# Patient Record
Sex: Female | Born: 1985
Health system: Southern US, Community
[De-identification: ages and names within clinical notes are randomized; demographics above are authoritative.]

## PROBLEM LIST (undated history)

## (undated) DIAGNOSIS — IMO0002 Reserved for concepts with insufficient information to code with codable children: Secondary | ICD-10-CM

## (undated) DIAGNOSIS — J342 Deviated nasal septum: Secondary | ICD-10-CM

## (undated) DIAGNOSIS — L509 Urticaria, unspecified: Secondary | ICD-10-CM

## (undated) DIAGNOSIS — R7303 Prediabetes: Secondary | ICD-10-CM

## (undated) DIAGNOSIS — M545 Low back pain, unspecified: Secondary | ICD-10-CM

## (undated) DIAGNOSIS — R87619 Unspecified abnormal cytological findings in specimens from cervix uteri: Secondary | ICD-10-CM

## (undated) HISTORY — DX: Reserved for concepts with insufficient information to code with codable children: IMO0002

## (undated) HISTORY — DX: Low back pain: M54.5

## (undated) HISTORY — DX: Unspecified abnormal cytological findings in specimens from cervix uteri: R87.619

## (undated) HISTORY — PX: EXCISION ORAL TUMOR: SHX6265

## (undated) HISTORY — DX: Urticaria, unspecified: L50.9

## (undated) HISTORY — PX: WISDOM TOOTH EXTRACTION: SHX21

## (undated) HISTORY — DX: Low back pain, unspecified: M54.50

---
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
## 2004-05-24 ENCOUNTER — Emergency Department (HOSPITAL_COMMUNITY): Admission: EM | Admit: 2004-05-24 | Discharge: 2004-05-24 | Payer: Self-pay | Admitting: Emergency Medicine

## 2005-02-18 ENCOUNTER — Emergency Department (HOSPITAL_COMMUNITY): Admission: EM | Admit: 2005-02-18 | Discharge: 2005-02-18 | Payer: Self-pay | Admitting: Emergency Medicine

## 2005-04-21 ENCOUNTER — Ambulatory Visit: Payer: Self-pay | Admitting: Internal Medicine

## 2005-06-14 ENCOUNTER — Ambulatory Visit: Payer: Self-pay | Admitting: Internal Medicine

## 2005-07-02 ENCOUNTER — Emergency Department (HOSPITAL_COMMUNITY): Admission: EM | Admit: 2005-07-02 | Discharge: 2005-07-02 | Payer: Self-pay | Admitting: Emergency Medicine

## 2005-08-07 ENCOUNTER — Ambulatory Visit: Payer: Self-pay | Admitting: Internal Medicine

## 2005-10-30 ENCOUNTER — Ambulatory Visit: Payer: Self-pay | Admitting: Hospitalist

## 2006-01-10 ENCOUNTER — Emergency Department (HOSPITAL_COMMUNITY): Admission: EM | Admit: 2006-01-10 | Discharge: 2006-01-11 | Payer: Self-pay | Admitting: Emergency Medicine

## 2006-01-22 ENCOUNTER — Ambulatory Visit: Payer: Self-pay | Admitting: Internal Medicine

## 2006-02-14 ENCOUNTER — Ambulatory Visit: Payer: Self-pay | Admitting: Internal Medicine

## 2006-02-15 ENCOUNTER — Ambulatory Visit: Payer: Self-pay | Admitting: Internal Medicine

## 2006-03-05 ENCOUNTER — Ambulatory Visit (HOSPITAL_COMMUNITY): Admission: RE | Admit: 2006-03-05 | Discharge: 2006-03-05 | Payer: Self-pay | Admitting: *Deleted

## 2006-04-17 ENCOUNTER — Emergency Department (HOSPITAL_COMMUNITY): Admission: EM | Admit: 2006-04-17 | Discharge: 2006-04-17 | Payer: Self-pay | Admitting: Family Medicine

## 2006-05-03 ENCOUNTER — Ambulatory Visit: Payer: Self-pay | Admitting: Internal Medicine

## 2006-05-04 ENCOUNTER — Ambulatory Visit: Payer: Self-pay | Admitting: Internal Medicine

## 2006-07-16 ENCOUNTER — Ambulatory Visit: Payer: Self-pay | Admitting: Hospitalist

## 2006-07-26 ENCOUNTER — Ambulatory Visit: Payer: Self-pay | Admitting: Internal Medicine

## 2006-08-14 ENCOUNTER — Emergency Department (HOSPITAL_COMMUNITY): Admission: EM | Admit: 2006-08-14 | Discharge: 2006-08-14 | Payer: Self-pay | Admitting: Family Medicine

## 2006-10-15 ENCOUNTER — Ambulatory Visit: Payer: Self-pay | Admitting: Internal Medicine

## 2006-10-22 DIAGNOSIS — J4520 Mild intermittent asthma, uncomplicated: Secondary | ICD-10-CM | POA: Insufficient documentation

## 2006-11-21 ENCOUNTER — Ambulatory Visit: Payer: Self-pay | Admitting: Internal Medicine

## 2006-11-21 DIAGNOSIS — J019 Acute sinusitis, unspecified: Secondary | ICD-10-CM | POA: Insufficient documentation

## 2006-11-21 DIAGNOSIS — L209 Atopic dermatitis, unspecified: Secondary | ICD-10-CM | POA: Insufficient documentation

## 2007-01-07 ENCOUNTER — Ambulatory Visit: Payer: Self-pay | Admitting: Internal Medicine

## 2007-01-14 ENCOUNTER — Emergency Department (HOSPITAL_COMMUNITY): Admission: EM | Admit: 2007-01-14 | Discharge: 2007-01-14 | Payer: Self-pay | Admitting: Emergency Medicine

## 2007-01-15 DIAGNOSIS — R87610 Atypical squamous cells of undetermined significance on cytologic smear of cervix (ASC-US): Secondary | ICD-10-CM | POA: Insufficient documentation

## 2007-02-06 ENCOUNTER — Ambulatory Visit: Payer: Self-pay | Admitting: Hospitalist

## 2007-02-06 ENCOUNTER — Encounter (INDEPENDENT_AMBULATORY_CARE_PROVIDER_SITE_OTHER): Payer: Self-pay | Admitting: Internal Medicine

## 2007-02-06 ENCOUNTER — Encounter (INDEPENDENT_AMBULATORY_CARE_PROVIDER_SITE_OTHER): Payer: Self-pay | Admitting: *Deleted

## 2007-02-06 LAB — CONVERTED CEMR LAB
AST: 17 units/L (ref 0–37)
Alkaline Phosphatase: 67 units/L (ref 39–117)
BUN: 11 mg/dL (ref 6–23)
CO2: 22 meq/L (ref 19–32)
Calcium: 10.1 mg/dL (ref 8.4–10.5)
Chlamydia, DNA Probe: NEGATIVE
Creatinine, Ser: 0.66 mg/dL (ref 0.40–1.20)
GC Probe Amp, Genital: NEGATIVE
MCHC: 32.4 g/dL (ref 30.0–36.0)
MCV: 92 fL (ref 78.0–100.0)
Potassium: 4.4 meq/L (ref 3.5–5.3)
RDW: 13.6 % (ref 11.5–14.0)
WBC: 6.5 10*3/uL (ref 4.0–10.5)

## 2007-02-07 LAB — CONVERTED CEMR LAB
Candida species: NEGATIVE
Gardnerella vaginalis: POSITIVE — AB
Trichomonal Vaginitis: NEGATIVE

## 2007-02-19 ENCOUNTER — Telehealth: Payer: Self-pay | Admitting: *Deleted

## 2007-02-22 ENCOUNTER — Telehealth: Payer: Self-pay | Admitting: *Deleted

## 2007-03-01 ENCOUNTER — Telehealth: Payer: Self-pay | Admitting: *Deleted

## 2007-03-14 ENCOUNTER — Emergency Department (HOSPITAL_COMMUNITY): Admission: EM | Admit: 2007-03-14 | Discharge: 2007-03-14 | Payer: Self-pay | Admitting: Family Medicine

## 2007-03-15 ENCOUNTER — Telehealth: Payer: Self-pay | Admitting: *Deleted

## 2007-03-27 ENCOUNTER — Ambulatory Visit: Payer: Self-pay | Admitting: *Deleted

## 2007-03-27 ENCOUNTER — Encounter: Payer: Self-pay | Admitting: Obstetrics and Gynecology

## 2007-03-27 ENCOUNTER — Other Ambulatory Visit: Admission: RE | Admit: 2007-03-27 | Discharge: 2007-03-27 | Payer: Self-pay | Admitting: Obstetrics and Gynecology

## 2007-04-02 ENCOUNTER — Ambulatory Visit: Payer: Self-pay | Admitting: Internal Medicine

## 2007-04-10 ENCOUNTER — Ambulatory Visit: Payer: Self-pay | Admitting: Obstetrics & Gynecology

## 2007-04-10 ENCOUNTER — Encounter (INDEPENDENT_AMBULATORY_CARE_PROVIDER_SITE_OTHER): Payer: Self-pay | Admitting: *Deleted

## 2007-05-21 ENCOUNTER — Emergency Department (HOSPITAL_COMMUNITY): Admission: EM | Admit: 2007-05-21 | Discharge: 2007-05-21 | Payer: Self-pay | Admitting: Emergency Medicine

## 2007-05-22 ENCOUNTER — Encounter (INDEPENDENT_AMBULATORY_CARE_PROVIDER_SITE_OTHER): Payer: Self-pay | Admitting: *Deleted

## 2007-06-24 ENCOUNTER — Ambulatory Visit: Payer: Self-pay | Admitting: Internal Medicine

## 2007-07-21 ENCOUNTER — Emergency Department (HOSPITAL_COMMUNITY): Admission: EM | Admit: 2007-07-21 | Discharge: 2007-07-21 | Payer: Self-pay | Admitting: Family Medicine

## 2007-07-22 ENCOUNTER — Telehealth (INDEPENDENT_AMBULATORY_CARE_PROVIDER_SITE_OTHER): Payer: Self-pay | Admitting: *Deleted

## 2007-07-26 ENCOUNTER — Ambulatory Visit: Payer: Self-pay | Admitting: Internal Medicine

## 2007-08-13 ENCOUNTER — Telehealth: Payer: Self-pay | Admitting: *Deleted

## 2007-09-04 ENCOUNTER — Ambulatory Visit: Payer: Self-pay | Admitting: Obstetrics & Gynecology

## 2007-09-17 ENCOUNTER — Ambulatory Visit: Payer: Self-pay | Admitting: Internal Medicine

## 2007-09-20 ENCOUNTER — Telehealth: Payer: Self-pay | Admitting: *Deleted

## 2007-09-26 DIAGNOSIS — H5710 Ocular pain, unspecified eye: Secondary | ICD-10-CM | POA: Insufficient documentation

## 2007-09-27 ENCOUNTER — Ambulatory Visit: Payer: Self-pay | Admitting: Internal Medicine

## 2007-09-27 ENCOUNTER — Encounter (INDEPENDENT_AMBULATORY_CARE_PROVIDER_SITE_OTHER): Payer: Self-pay | Admitting: *Deleted

## 2007-10-11 ENCOUNTER — Emergency Department (HOSPITAL_COMMUNITY): Admission: EM | Admit: 2007-10-11 | Discharge: 2007-10-11 | Payer: Self-pay | Admitting: Family Medicine

## 2007-10-17 ENCOUNTER — Emergency Department (HOSPITAL_COMMUNITY): Admission: EM | Admit: 2007-10-17 | Discharge: 2007-10-17 | Payer: Self-pay | Admitting: Emergency Medicine

## 2007-10-21 ENCOUNTER — Emergency Department (HOSPITAL_COMMUNITY): Admission: EM | Admit: 2007-10-21 | Discharge: 2007-10-21 | Payer: Self-pay | Admitting: Family Medicine

## 2007-11-13 ENCOUNTER — Ambulatory Visit: Payer: Self-pay | Admitting: Obstetrics & Gynecology

## 2007-11-13 ENCOUNTER — Other Ambulatory Visit: Admission: RE | Admit: 2007-11-13 | Discharge: 2007-11-13 | Payer: Self-pay | Admitting: Obstetrics and Gynecology

## 2007-11-21 ENCOUNTER — Encounter (INDEPENDENT_AMBULATORY_CARE_PROVIDER_SITE_OTHER): Payer: Self-pay | Admitting: *Deleted

## 2007-11-29 ENCOUNTER — Ambulatory Visit: Payer: Self-pay | Admitting: Obstetrics and Gynecology

## 2007-12-17 ENCOUNTER — Telehealth: Payer: Self-pay | Admitting: *Deleted

## 2008-01-17 ENCOUNTER — Emergency Department (HOSPITAL_COMMUNITY): Admission: EM | Admit: 2008-01-17 | Discharge: 2008-01-17 | Payer: Self-pay | Admitting: Family Medicine

## 2008-02-20 ENCOUNTER — Telehealth (INDEPENDENT_AMBULATORY_CARE_PROVIDER_SITE_OTHER): Payer: Self-pay | Admitting: *Deleted

## 2008-03-27 ENCOUNTER — Telehealth (INDEPENDENT_AMBULATORY_CARE_PROVIDER_SITE_OTHER): Payer: Self-pay | Admitting: *Deleted

## 2008-05-19 ENCOUNTER — Telehealth (INDEPENDENT_AMBULATORY_CARE_PROVIDER_SITE_OTHER): Payer: Self-pay | Admitting: *Deleted

## 2008-05-29 ENCOUNTER — Encounter: Payer: Self-pay | Admitting: Obstetrics & Gynecology

## 2008-05-29 ENCOUNTER — Ambulatory Visit: Payer: Self-pay | Admitting: Obstetrics & Gynecology

## 2008-07-29 ENCOUNTER — Emergency Department (HOSPITAL_COMMUNITY): Admission: EM | Admit: 2008-07-29 | Discharge: 2008-07-29 | Payer: Self-pay | Admitting: Family Medicine

## 2008-08-05 ENCOUNTER — Ambulatory Visit: Payer: Self-pay | Admitting: Internal Medicine

## 2008-09-21 ENCOUNTER — Emergency Department (HOSPITAL_COMMUNITY): Admission: EM | Admit: 2008-09-21 | Discharge: 2008-09-21 | Payer: Self-pay | Admitting: Emergency Medicine

## 2008-12-18 ENCOUNTER — Ambulatory Visit: Payer: Self-pay | Admitting: Obstetrics and Gynecology

## 2008-12-18 ENCOUNTER — Encounter: Payer: Self-pay | Admitting: Obstetrics & Gynecology

## 2008-12-18 LAB — CONVERTED CEMR LAB

## 2009-02-24 ENCOUNTER — Ambulatory Visit: Payer: Self-pay | Admitting: Obstetrics and Gynecology

## 2009-04-23 ENCOUNTER — Emergency Department (HOSPITAL_COMMUNITY): Admission: EM | Admit: 2009-04-23 | Discharge: 2009-04-23 | Payer: Self-pay | Admitting: Emergency Medicine

## 2009-05-26 ENCOUNTER — Telehealth: Payer: Self-pay | Admitting: Internal Medicine

## 2009-05-27 ENCOUNTER — Telehealth: Payer: Self-pay | Admitting: *Deleted

## 2009-06-10 ENCOUNTER — Ambulatory Visit: Payer: Self-pay | Admitting: Internal Medicine

## 2009-06-10 ENCOUNTER — Encounter: Payer: Self-pay | Admitting: Internal Medicine

## 2009-06-10 DIAGNOSIS — R5383 Other fatigue: Secondary | ICD-10-CM

## 2009-06-10 DIAGNOSIS — R5381 Other malaise: Secondary | ICD-10-CM | POA: Insufficient documentation

## 2009-06-11 ENCOUNTER — Encounter: Payer: Self-pay | Admitting: Internal Medicine

## 2009-06-11 LAB — CONVERTED CEMR LAB
ALT: 9 units/L (ref 0–35)
Albumin: 4.7 g/dL (ref 3.5–5.2)
BUN: 13 mg/dL (ref 6–23)
Basophils Relative: 1 % (ref 0–1)
Calcium: 9.8 mg/dL (ref 8.4–10.5)
Chloride: 103 meq/L (ref 96–112)
Eosinophils Absolute: 0.7 10*3/uL (ref 0.0–0.7)
Eosinophils Relative: 10 % — ABNORMAL HIGH (ref 0–5)
Glucose, Bld: 92 mg/dL (ref 70–99)
HCT: 37.4 % (ref 36.0–46.0)
Hemoglobin, Urine: NEGATIVE
Lymphs Abs: 3.3 10*3/uL (ref 0.7–4.0)
MCHC: 32.1 g/dL (ref 30.0–36.0)
Neutrophils Relative %: 35 % — ABNORMAL LOW (ref 43–77)
Platelets: 323 10*3/uL (ref 150–400)
Protein, ur: NEGATIVE mg/dL
Sodium: 138 meq/L (ref 135–145)
Total Bilirubin: 0.2 mg/dL — ABNORMAL LOW (ref 0.3–1.2)
Total Protein: 7.7 g/dL (ref 6.0–8.3)
Urine Glucose: NEGATIVE mg/dL
pH: 6.5 (ref 5.0–8.0)

## 2009-07-12 ENCOUNTER — Telehealth: Payer: Self-pay | Admitting: Internal Medicine

## 2009-07-15 ENCOUNTER — Encounter: Payer: Self-pay | Admitting: Physician Assistant

## 2009-07-15 ENCOUNTER — Ambulatory Visit: Payer: Self-pay | Admitting: Obstetrics & Gynecology

## 2009-07-28 ENCOUNTER — Telehealth: Payer: Self-pay | Admitting: Internal Medicine

## 2009-08-10 ENCOUNTER — Emergency Department (HOSPITAL_COMMUNITY): Admission: EM | Admit: 2009-08-10 | Discharge: 2009-08-10 | Payer: Self-pay | Admitting: Family Medicine

## 2009-09-02 ENCOUNTER — Emergency Department (HOSPITAL_COMMUNITY): Admission: EM | Admit: 2009-09-02 | Discharge: 2009-09-02 | Payer: Self-pay | Admitting: Emergency Medicine

## 2009-09-14 ENCOUNTER — Telehealth: Payer: Self-pay | Admitting: Internal Medicine

## 2009-11-27 ENCOUNTER — Emergency Department (HOSPITAL_COMMUNITY): Admission: EM | Admit: 2009-11-27 | Discharge: 2009-11-27 | Payer: Self-pay | Admitting: Emergency Medicine

## 2010-01-14 ENCOUNTER — Ambulatory Visit: Payer: Self-pay | Admitting: Internal Medicine

## 2010-01-14 DIAGNOSIS — R42 Dizziness and giddiness: Secondary | ICD-10-CM | POA: Insufficient documentation

## 2010-01-14 DIAGNOSIS — L0293 Carbuncle, unspecified: Secondary | ICD-10-CM

## 2010-01-14 DIAGNOSIS — L0292 Furuncle, unspecified: Secondary | ICD-10-CM | POA: Insufficient documentation

## 2010-01-17 ENCOUNTER — Telehealth: Payer: Self-pay | Admitting: Internal Medicine

## 2010-04-25 ENCOUNTER — Telehealth: Payer: Self-pay | Admitting: *Deleted

## 2010-05-04 ENCOUNTER — Telehealth: Payer: Self-pay | Admitting: Internal Medicine

## 2010-06-10 ENCOUNTER — Ambulatory Visit: Payer: Self-pay | Admitting: Obstetrics & Gynecology

## 2010-06-10 LAB — CONVERTED CEMR LAB
Prolactin: 5.6 ng/mL
TSH: 1.036 microintl units/mL (ref 0.350–4.500)

## 2010-06-22 ENCOUNTER — Emergency Department (HOSPITAL_COMMUNITY): Admission: EM | Admit: 2010-06-22 | Discharge: 2010-06-22 | Payer: Self-pay | Admitting: Emergency Medicine

## 2010-06-23 ENCOUNTER — Telehealth: Payer: Self-pay | Admitting: *Deleted

## 2010-07-18 ENCOUNTER — Ambulatory Visit: Payer: Self-pay | Admitting: Obstetrics and Gynecology

## 2010-07-29 ENCOUNTER — Telehealth: Payer: Self-pay | Admitting: Internal Medicine

## 2010-08-02 ENCOUNTER — Inpatient Hospital Stay (HOSPITAL_COMMUNITY): Admission: AD | Admit: 2010-08-02 | Discharge: 2010-08-02 | Payer: Self-pay | Admitting: Obstetrics and Gynecology

## 2010-08-31 ENCOUNTER — Ambulatory Visit: Payer: Self-pay | Admitting: Internal Medicine

## 2010-08-31 ENCOUNTER — Telehealth: Payer: Self-pay | Admitting: *Deleted

## 2010-08-31 DIAGNOSIS — J069 Acute upper respiratory infection, unspecified: Secondary | ICD-10-CM | POA: Insufficient documentation

## 2010-08-31 DIAGNOSIS — H109 Unspecified conjunctivitis: Secondary | ICD-10-CM | POA: Insufficient documentation

## 2010-09-12 ENCOUNTER — Telehealth: Payer: Self-pay | Admitting: Internal Medicine

## 2010-09-15 ENCOUNTER — Telehealth: Payer: Self-pay | Admitting: Internal Medicine

## 2010-09-16 ENCOUNTER — Ambulatory Visit: Payer: Self-pay | Admitting: Internal Medicine

## 2010-09-16 DIAGNOSIS — N898 Other specified noninflammatory disorders of vagina: Secondary | ICD-10-CM | POA: Insufficient documentation

## 2010-09-17 ENCOUNTER — Encounter: Payer: Self-pay | Admitting: Internal Medicine

## 2010-09-19 ENCOUNTER — Encounter: Payer: Self-pay | Admitting: Internal Medicine

## 2010-09-19 LAB — CONVERTED CEMR LAB

## 2010-09-20 DIAGNOSIS — N760 Acute vaginitis: Secondary | ICD-10-CM | POA: Insufficient documentation

## 2010-09-20 LAB — CONVERTED CEMR LAB: Candida species: NEGATIVE

## 2010-09-21 LAB — CONVERTED CEMR LAB: Chlamydia, DNA Probe: NEGATIVE

## 2010-10-10 ENCOUNTER — Inpatient Hospital Stay (HOSPITAL_COMMUNITY)
Admission: AD | Admit: 2010-10-10 | Discharge: 2010-10-10 | Payer: Self-pay | Source: Home / Self Care | Attending: Obstetrics & Gynecology | Admitting: Obstetrics & Gynecology

## 2010-11-06 ENCOUNTER — Encounter: Payer: Self-pay | Admitting: *Deleted

## 2010-11-15 NOTE — Progress Notes (Signed)
Summary: phone/gg  Phone Note Call from Patient   Summary of Call: Pt called with c/o redness and d/c from rt eye.  Onset 2 days ago.  will see today Initial call taken by: Merrie Roof RN,  August 31, 2010 11:30 AM

## 2010-11-15 NOTE — Assessment & Plan Note (Signed)
Summary: boils on legs and side hurting/cfb   Vital Signs:  Patient profile:   25 year old female Height:      62 inches (157.48 cm) Weight:      115.4 pounds (52.45 kg) BMI:     21.18 Pulse rate:   66 / minute BP sitting:   117 / 79  (right arm) Cuff size:   med  Vitals Entered By: Theotis Barrio NT II (January 14, 2010 2:28 PM) CC: BOILS ON LEGS  FOR ABOUT 2 WEEKS Is Patient Diabetic? No Pain Assessment Patient in pain? no      Nutritional Status BMI of 19 -24 = normal  Have you ever been in a relationship where you felt threatened, hurt or afraid?No   Does patient need assistance? Functional Status Self care Ambulation Normal Comments BOILS ON LEGS FOR ABOUT 2 WEEKS   Primary Care Provider:  Clerance Lav MD  CC:  BOILS ON LEGS  FOR ABOUT 2 WEEKS.  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 They exude and then go away. They dont give her fever b ut they are often painful.   Preventive Screening-Counseling & Management  Alcohol-Tobacco     Alcohol drinks/day: <1     Alcohol type: mixed drinks     Smoking Status: never     Smoking Cessation Counseling: yes     Passive Smoke Exposure: yes  Caffeine-Diet-Exercise     Does Patient Exercise: no  Allergies (verified): 1)  ! Amoxicillin (Amoxicillin) 2)  ! * Tomato  Past History:  Past Medical History: Last updated: 09/27/2007 ASTHMA ECZEMA- Atopic dermatitis ASCUS    - 12/08 - refer back to gyn    -3/08 - s/p colposcopy  Past Surgical History: Last updated: 09/27/2007 NONE  Family History: Last updated: 02/06/2007 Mother alive age 105 unknown health history. Grandmother with diabetes. 2 brothers and 1 sister all in good health.  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   Risk Factors: Alcohol  Use: <1 (01/14/2010) Exercise: no (01/14/2010)  Risk Factors: Smoking Status: never (01/14/2010) Passive Smoke Exposure: yes (01/14/2010)  Review of Systems      See HPI  Physical Exam  General:  thin appearing,in no acute distress; alert,appropriate and cooperative throughout examination Head:  Normocephalic and atraumatic without obvious abnormalities. No apparent alopecia or balding. Eyes:  No corneal or conjunctival inflammation noted. EOMI. Perrla. Vision grossly normal. Ears:  left ear is full. buldging of TM. no exudate or perforation.  Nose:  boggy red nasal mucosa Mouth:  Oral mucosa and oropharynx without lesions or exudates.  Teeth in good repair. Neck:  No deformities, masses, or tenderness noted. Lungs:  Normal respiratory effort, chest expands symmetrically. Lungs are clear to auscultation, no crackles or wheezes. Heart:  Normal rate and regular rhythm. S1 and S2 normal without gallop, murmur, click, rub or other extra sounds. Abdomen:  Bowel sounds positive,abdomen soft and non-tender without masses, organomegaly or hernias noted. Msk:  No deformity or scoliosis noted of thoracic or lumbar spine.   Neurologic:  No cranial nerve deficits noted. Station and gait are normal. Plantar reflexes are down-going bilaterally. DTRs are symmetrical throughout. Sensory, motor and coordinative functions appear intact. Skin:  multiple 2-4 cm boils at different sites. most in resolving states.  Psych:  Cognition and judgment  appear intact. Alert and cooperative with normal attention span and concentration. No apparent delusions, illusions, hallucinations   Impression & Recommendations:  Problem # 1:  FURUNCLE, RECURRENT (ICD-680.9) furuncle and/or boils at different sites. no other predisposing factors. Advised on pathology and elimination of nasal flora to prevent recurrent infecitons. she voices understanding. I will give bactrim for acute problem. Also advised bactoban for nasal  application. Pt advised on possibility of contraception failure secondary to drug drug interaction and precaution to use two method of contraception for child birth prevention.   Problem # 2:  DIZZINESS (ICD-780.4) ongoing for last couple of weeks. has allergy symptoms. Physical exam show likely serous ottitis media from allergy . Will start her on decongestant based antihistamine and then shift to only antihistamine. Advised on proper use of nasonex.  Her updated medication list for this problem includes:    Zyrtec Allergy 10 Mg Tabs (Cetirizine hcl) .Marland Kitchen... Take 1 tablet by mouth once a day start after done with zyrtec d  Problem # 3:  ASCUS PAP (ICD-795.01) [facility] Would like them to follow it nowonwards.   Complete Medication List: 1)  Advair Diskus 250-50 Mcg/dose Misc (Fluticasone-salmeterol) .... Inhale one puff two times a day. 2)  Proventil Hfa 108 (90 Base) Mcg/act Aers (Albuterol sulfate) .... Inhale 1-2 puffs every 4 hours as needed 3)  Nasonex 50 Mcg/act Susp (Mometasone furoate) .... 2 sprays in each nostril daily 4)  Prempro 0.625-2.5 Mg Tabs (Conj estrog-medroxyprogest ace) .... Take 1 tablet by mouth once a day 5)  Triamcinolone Acetonide 0.1 % Crea (Triamcinolone acetonide) .... Apply to affected are in thin layer 1-2 times a day for total of 14 days 6)  Zyrtec-d Allergy & Congestion 5-120 Mg Xr12h-tab (Cetirizine-pseudoephedrine) .... One tablet each day 7)  Zyrtec Allergy 10 Mg Tabs (Cetirizine hcl) .... Take 1 tablet by mouth once a day start after done with zyrtec d 8)  Bactrim Ds 800-160 Mg Tabs (Sulfamethoxazole-trimethoprim) .... Take 1 tablet by mouth two times a day 9)  Bactroban 2 % Oint (Mupirocin) .... Apply in each nasal cavity with q tip each night for 7-10 days  Patient Instructions: 1)  Use ANTIBaterial or antimicrobial soap for next 2-3 months. 2)  Finish antibiotic course prescribed. 3)  Take Zyrtec D for next 2 weeks and switch over  to zyrtec thereafter for next 2-3 months. 4)  Use nasonex before you get congestion. Aim the nossle away from your nasal septum.  5)  Use Bactroban ointment in both nasal cavity applied with q tip each night for 7-10 days.  Prescriptions: BACTROBAN 2 % OINT (MUPIROCIN) apply in each nasal cavity with q tip each night for 7-10 days  #1 x 0   Entered and Authorized by:   Clerance Lav MD   Signed by:   Clerance Lav MD on 01/14/2010   Method used:   Print then Give to Patient   RxID:   [id number] BACTRIM DS 800-160 MG TABS (SULFAMETHOXAZOLE-TRIMETHOPRIM) Take 1 tablet by mouth two times a day  #20 x 0   Entered and Authorized by:   Clerance Lav MD   Signed by:   Clerance Lav MD on 01/14/2010   Method used:   Print then Give to Patient   RxID:   [id number] ZYRTEC ALLERGY 10 MG TABS (CETIRIZINE HCL) Take 1 tablet by mouth once a day start after done with zyrtec D  #30 x 0   Entered and Authorized by:   [employer]  Sherryll Burger MD   Signed by:   Clerance Lav MD on 01/14/2010   Method used:   Print then Give to Patient   RxID:   [id number] ZYRTEC-D ALLERGY & CONGESTION 5-120 MG XR12H-TAB (CETIRIZINE-PSEUDOEPHEDRINE) one tablet each day  #14 x 0   Entered and Authorized by:   Clerance Lav MD   Signed by:   Clerance Lav MD on 01/14/2010   Method used:   Print then Give to Patient   RxID:   [id number]   Prevention & Chronic Care Immunizations   Influenza vaccine: Fluvax 3+  (08/05/2008)   Influenza vaccine deferral: Deferred  (01/14/2010)    Tetanus booster: Not documented    Pneumococcal vaccine: Not documented  Other Screening   Pap smear: NEGATIVE FOR INTRAEPITHELIAL LESIONS OR MALIGNANCY.  (07/15/2009)   Smoking status: never  (01/14/2010)

## 2010-11-15 NOTE — Progress Notes (Signed)
Summary: eyes/ hla  Phone Note Call from Patient   Summary of Call: pt calls to request additional treatment for eye problem that she was in clinic for recently, problem has gotten worse, now in both eyes, appt given for 1630, dr Threasa Beards Initial call taken by: Marin Roberts RN,  September 12, 2010 10:41 AM  Follow-up for Phone Call        Agree with plan as above. Follow-up by: Margarito Liner MD,  September 12, 2010 10:56 AM  Additional Follow-up for Phone Call Additional follow up Details #1::       Additional Follow-up by: Clerance Lav MD,  September 12, 2010 12:10 PM

## 2010-11-15 NOTE — Assessment & Plan Note (Signed)
Summary: eye pain/gg   Vital Signs:  Patient profile:   25 year old female Height:      62 inches Weight:      115.3 pounds BMI:     21.16 Temp:     99.4 degrees F oral Pulse rate:   75 / minute BP sitting:   108 / 71  (right arm)  Vitals Entered By: Filomena Jungling NT II (August 31, 2010 4:45 PM) CC: RIGHT EYE PAIN AND DRAINAGE/ NEED REFILL ON ALBUERTOL Is Patient Diabetic? No Pain Assessment Patient in pain? no      Nutritional Status BMI of 19 -24 = normal  Have you ever been in a relationship where you felt threatened, hurt or afraid?No   Does patient need assistance? Functional Status Self care Ambulation Normal   Primary Care Provider:  Clerance Lav MD  CC:  RIGHT EYE PAIN AND DRAINAGE/ NEED REFILL ON ALBUERTOL.  History of Present Illness: Pt is a44 yo AAF with PMH of asthma who came here for right eye redness with drainage. She has cough with whitish sputum for 3-4 days, also has sore throat, sneeze and running nose. 2 days ago she started to have right eye redness with whitish thick discharge this morning. She has no fever, SOB, CP or vision change, eye pain. She also wants to refill her med. She works in a [employer]. No other c/o.    Preventive Screening-Counseling & Management  Alcohol-Tobacco     Alcohol drinks/day: <1     Alcohol type: mixed drinks     Smoking Status: never     Smoking Cessation Counseling: yes     Passive Smoke Exposure: yes  Caffeine-Diet-Exercise     Does Patient Exercise: no  Pap Smear  Procedure date:  07/18/2010  Findings:      No malignancy.   Problems Prior to Update: 1)  Dizziness  (ICD-780.4) 2)  Furuncle, Recurrent  (ICD-680.9) 3)  Weakness  (ICD-780.79) 4)  Eye Pain, Right  (ICD-379.91) 5)  Ascus Pap  (ICD-795.01) 6)  Contraceptive Management  (ICD-V25.09) 7)  Dermatitis, Other Atopic  (ICD-691.8) 8)  Sinusitis, Acute  (ICD-461.9) 9)  Asthma  (ICD-493.90)  Medications Prior to Update: 1)  Advair Diskus  250-50 Mcg/dose  Misc (Fluticasone-Salmeterol) .... Inhale One Puff Two Times A Day. 2)  Proventil Hfa 108 (90 Base) Mcg/act  Aers (Albuterol Sulfate) .... Inhale 1-2 Puffs Every 4 Hours As Needed 3)  Nasonex 50 Mcg/act  Susp (Mometasone Furoate) .... 2 Sprays in Each Nostril Daily 4)  Prempro 0.625-2.5 Mg Tabs (Conj Estrog-Medroxyprogest Ace) .... Take 1 Tablet By Mouth Once A Day 5)  Triamcinolone Acetonide 0.1 % Crea (Triamcinolone Acetonide) .... Apply To Affected Are in Thin Layer 1-2 Times A Day For Total of 14 Days 6)  Zyrtec-D Allergy & Congestion 5-120 Mg Xr12h-Tab (Cetirizine-Pseudoephedrine) .... One Tablet Each Day 7)  Zyrtec Allergy 10 Mg Tabs (Cetirizine Hcl) .... Take 1 Tablet By Mouth Once A Day Start After Done With Zyrtec D 8)  Bactroban 2 % Oint (Mupirocin) .... Apply in Each Nasal Cavity With Q Tip Each Night For 7-10 Days  Current Medications (verified): 1)  Advair Diskus 250-50 Mcg/dose  Misc (Fluticasone-Salmeterol) .... Inhale One Puff Two Times A Day. 2)  Proventil Hfa 108 (90 Base) Mcg/act  Aers (Albuterol Sulfate) .... Inhale 1-2 Puffs Every 4 Hours As Needed 3)  Nasonex 50 Mcg/act  Susp (Mometasone Furoate) .... 2 Sprays in Each Nostril Daily 4)  Prempro 0.625-2.5  Mg Tabs (Conj Estrog-Medroxyprogest Ace) .... Take 1 Tablet By Mouth Once A Day 5)  Triamcinolone Acetonide 0.1 % Crea (Triamcinolone Acetonide) .... Apply To Affected Are in Thin Layer 1-2 Times A Day For Total of 14 Days 6)  Zyrtec-D Allergy & Congestion 5-120 Mg Xr12h-Tab (Cetirizine-Pseudoephedrine) .... One Tablet Each Day 7)  Zyrtec Allergy 10 Mg Tabs (Cetirizine Hcl) .... Take 1 Tablet By Mouth Once A Day Start After Done With Zyrtec D 8)  Bactroban 2 % Oint (Mupirocin) .... Apply in Each Nasal Cavity With Q Tip Each Night For 7-10 Days  Allergies (verified): 1)  ! Amoxicillin (Amoxicillin) 2)  ! Bactrim Ds (Sulfamethoxazole-Trimethoprim) 3)  ! * Tomato  Past History:  Past Medical History: Last  updated: 09/27/2007 ASTHMA ECZEMA- Atopic dermatitis ASCUS    - 12/08 - refer back to gyn    -3/08 - s/p colposcopy  Family History: Last updated: 02/06/2007 Mother alive age 53 unknown health history. Grandmother with diabetes. 2 brothers and 1 sister all in good health.  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   Risk Factors: Smoking Status: never (08/31/2010) Passive Smoke Exposure: yes (08/31/2010) PMH-FH-SH reviewed for relevance  Family History: Reviewed history from 02/06/2007 and no changes required. Mother alive age 37 unknown health history. Grandmother with diabetes. 2 brothers and 1 sister all in good health.  Social History: Reviewed history from 06/10/2009 and no changes required. 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   Review of Systems       The patient complains of prolonged cough.  The patient denies fever, syncope, dyspnea on exertion, peripheral edema, headaches, hemoptysis, abdominal pain, and melena.    Physical Exam  General:  alert, well-developed, well-nourished, and well-hydrated.   Eyes:  Right eye vision grossly intact, pupils equal, pupils round, pupils reactive to light, and conjunctival injection.   Nose:  no nasal discharge, mucosal erythema, and mucosal edema.   Mouth:  pharynx pink and moist, no exudates, and pharyngeal erythema.   Neck:  supple.   Lungs:  normal respiratory effort, normal breath sounds, no crackles, and no wheezes.   Heart:  normal rate, regular rhythm, no murmur, and no JVD.   Abdomen:  soft, non-tender, normal bowel sounds, and no distention.   Msk:  normal ROM, no joint tenderness, no joint swelling, no joint warmth, and no redness over joints.   Extremities:  No edema.  Neurologic:  alert & oriented X3 and gait normal.     Impression & Recommendations:  Problem #  1:  CONJUNCTIVITIS (ICD-372.30) Assessment New Her eye symptom is likely due to conjunctivitis, viral vs bacterial. Will give cipro eye drops to see response. If no improvement, needs return and have eye referral. Discussed treatment, and urged patient to wash hands carefully after touching face.   Her updated medication list for this problem includes:    Ciprofloxacin Hcl 0.3 % Soln (Ciprofloxacin hcl) .Marland Kitchen... 1-2 drio to the affected eye four time a day for 7 days.  Problem # 2:  URI (ICD-465.9) Assessment: Unchanged  Her cough symptom is likely due to bronchitis. Because she has asthma, will treat with 10 days of cipro to decrease asthma attack.  Her updated medication list for this problem includes:    Zyrtec-d Allergy & Congestion 5-120 Mg Xr12h-tab (Cetirizine-pseudoephedrine) ..... One tablet each day    Zyrtec Allergy 10 Mg Tabs (Cetirizine  hcl) ..... Take 1 tablet by mouth once a day start after done with zyrtec d  Instructed on symptomatic treatment. Call if symptoms persist or worsen.   Problem # 3:  ASTHMA (ICD-493.90) Assessment: Unchanged No sob or wheezing. Will give refills.  Her updated medication list for this problem includes:    Advair Diskus 250-50 Mcg/dose Misc (Fluticasone-salmeterol) ..... Inhale one puff two times a day.    Proventil Hfa 108 (90 Base) Mcg/act Aers (Albuterol sulfate) ..... Inhale 1-2 puffs every 4 hours as needed  Complete Medication List: 1)  Advair Diskus 250-50 Mcg/dose Misc (Fluticasone-salmeterol) .... Inhale one puff two times a day. 2)  Proventil Hfa 108 (90 Base) Mcg/act Aers (Albuterol sulfate) .... Inhale 1-2 puffs every 4 hours as needed 3)  Nasonex 50 Mcg/act Susp (Mometasone furoate) .... 2 sprays in each nostril daily 4)  Prempro 0.625-2.5 Mg Tabs (Conj estrog-medroxyprogest ace) .... Take 1 tablet by mouth once a day 5)  Triamcinolone Acetonide 0.1 % Crea (Triamcinolone acetonide) .... Apply to affected are in thin layer 1-2 times a  day for total of 14 days 6)  Zyrtec-d Allergy & Congestion 5-120 Mg Xr12h-tab (Cetirizine-pseudoephedrine) .... One tablet each day 7)  Zyrtec Allergy 10 Mg Tabs (Cetirizine hcl) .... Take 1 tablet by mouth once a day start after done with zyrtec d 8)  Bactroban 2 % Oint (Mupirocin) .... Apply in each nasal cavity with q tip each night for 7-10 days 9)  Ciprofloxacin Hcl 0.3 % Soln (Ciprofloxacin hcl) .Marland Kitchen.. 1-2 drio to the affected eye four time a day for 7 days. 10)  Ciprofloxacin Hcl 500 Mg Tabs (Ciprofloxacin hcl) .... Take 1 tablet by mouth two times a day for 10 days  Other Orders: Influenza Vaccine NON MCR (10272)  Patient Instructions: 1)  Please schedule a follow-up appointment in 3-4 months. 2)  If your symptoms no improvement, please come to the Clinic.  3)  Please drink more fluid and wash hands carefully after touching face. Prescriptions: PROVENTIL HFA 108 (90 BASE) MCG/ACT  AERS (ALBUTEROL SULFATE) Inhale 1-2 puffs every 4 hours as needed  #1 x 11   Entered and Authorized by:   Jackson Latino MD   Signed by:   Jackson Latino MD on 08/31/2010   Method used:   Print then Give to Patient   RxID:   [id number] ADVAIR DISKUS 250-50 MCG/DOSE  MISC (FLUTICASONE-SALMETEROL) Inhale one puff two times a day.  #1 x 5   Entered and Authorized by:   Jackson Latino MD   Signed by:   Jackson Latino MD on 08/31/2010   Method used:   Print then Give to Patient   RxID:   [id number] CIPROFLOXACIN HCL 500 MG TABS (CIPROFLOXACIN HCL) Take 1 tablet by mouth two times a day for 10 days  #20 x 0   Entered and Authorized by:   Jackson Latino MD   Signed by:   Jackson Latino MD on 08/31/2010   Method used:   Print then Give to Patient   RxID:   [id number] CIPROFLOXACIN HCL 0.3 % SOLN (CIPROFLOXACIN HCL) 1-2 drio to the affected eye four time a day for 7 days.  #1 x 1   Entered and Authorized by:   Jackson Latino MD   Signed by:   Jackson Latino MD on 08/31/2010   Method  used:   Print then Give to Patient   RxID:   [id number]    Orders Added: 1)  Influenza Vaccine NON MCR [00028]  2)  Est. Patient Level IV [98338]   Immunizations Administered:  Influenza Vaccine # 1:    Vaccine Type: Fluvax Non-MCR    Site: left deltoid    Mfr: GlaxoSmithKline    Dose: 0.5 ml    Route: IM    Given by: Stanton Kidney Ditzler RN    Exp. Date: 04/15/2011    Lot #: [id number]    VIS given: 05/10/10 version given August 31, 2010.  Flu Vaccine Consent Questions:    Do you have a history of severe allergic reactions to this vaccine? no    Any prior history of allergic reactions to egg and/or gelatin? no    Do you have a sensitivity to the preservative Thimersol? no    Do you have a past history of Guillan-Barre Syndrome? no    Do you currently have an acute febrile illness? no    Have you ever had a severe reaction to latex? no    Vaccine information given and explained to patient? yes    Are you currently pregnant? no   Immunizations Administered:  Influenza Vaccine # 1:    Vaccine Type: Fluvax Non-MCR    Site: left deltoid    Mfr: GlaxoSmithKline    Dose: 0.5 ml    Route: IM    Given by: Stanton Kidney Ditzler RN    Exp. Date: 04/15/2011    Lot #: [id number]    VIS given: 05/10/10 version given August 31, 2010.  Prevention & Chronic Care Immunizations   Influenza vaccine: Fluvax Non-MCR  (08/31/2010)   Influenza vaccine deferral: Deferred  (01/14/2010)    Tetanus booster: Not documented    Pneumococcal vaccine: Not documented  Other Screening   Pap smear: No malignancy.   (07/18/2010)   Smoking status: never  (08/31/2010)   Nursing Instructions: Give Flu vaccine today

## 2010-11-15 NOTE — Progress Notes (Signed)
Summary: Refill/gh  Phone Note Refill Request Message from:  Fax from Pharmacy on September 15, 2010 8:59 AM  Refills Requested: Medication #1:  NASONEX 50 MCG/ACT  SUSP 2 sprays in each nostril daily   Last Refilled: 03/26/2010  Method Requested: Fax to Local Pharmacy Initial call taken by: Angelina Ok RN,  September 15, 2010 8:59 AM  Follow-up for Phone Call       Follow-up by: Clerance Lav MD,  September 15, 2010 10:12 AM    Prescriptions: NASONEX 50 MCG/ACT  SUSP (MOMETASONE FUROATE) 2 sprays in each nostril daily  #1 x 11   Entered and Authorized by:   Clerance Lav MD   Signed by:   Clerance Lav MD on 09/15/2010   Method used:   Faxed to ...       [facility] Department (retail)       [address] [place], [place]  [address]       Ph: [phone]       Fax: [phone]   RxID:   [id number]

## 2010-11-15 NOTE — Progress Notes (Signed)
Summary: Refill/gh  Phone Note Refill Request Message from:  Fax from Pharmacy on July 29, 2010 5:04 PM  Refills Requested: Medication #1:  ADVAIR DISKUS 250-50 MCG/DOSE  MISC Inhale one puff two times a day.   Last Refilled: 04/26/2010  Method Requested: Fax to Local Pharmacy Initial call taken by: Angelina Ok RN,  July 29, 2010 5:04 PM  Follow-up for Phone Call        Has Dec appt with Dr Sherryll Burger Follow-up by: Blanch Media MD,  August 03, 2010 11:37 AM    Prescriptions: ADVAIR DISKUS 250-50 MCG/DOSE  MISC (FLUTICASONE-SALMETEROL) Inhale one puff two times a day.  #1 x 5   Entered and Authorized by:   Blanch Media MD   Signed by:   Blanch Media MD on 08/03/2010   Method used:   Faxed to ...       [facility] Department (retail)       [address] [place], [place]  [address]       Ph: [phone]       Fax: [phone]   RxID:   [id number]

## 2010-11-15 NOTE — Progress Notes (Signed)
Summary: mold ?'s/ hla  Phone Note Call from Patient   Summary of Call: pt calls to ask if mold in her apartment is the cause of her using her inhalers, i informed her i could not make the determination, that i would be glad to make her an appt w/ her pcp or another md. she declined but wanted to know if i could do something about the mold, i told her no but that she could notify the company or person she rents from or the health dept inspections dept for more information. she is agreeable to this and states if she changes her mind for an appt she will call back. Initial call taken by: Marin Roberts RN,  April 25, 2010 10:41 AM

## 2010-11-15 NOTE — Assessment & Plan Note (Signed)
Summary: checkup requesting pap smear vaginal discharge and itching /c...   Vital Signs:  Patient profile:   25 year old female Height:      62 inches (157.48 cm) Weight:      114.7 pounds (52.14 kg) BMI:     21.05 Temp:     98.4 degrees F (36.89 degrees C) oral Pulse rate:   82 / minute BP sitting:   119 / 74  (right arm) Cuff size:   regular  Vitals Entered By: Theotis Barrio NT II (September 16, 2010 4:31 PM) CC: PATIENT IS HERE FOR PAP SMEAR/ ???LAST PAP DONE IS OCT- PER PATIET / VAG DISCHARGE Is Patient Diabetic? No Pain Assessment Patient in pain? no      Nutritional Status BMI of 19 -24 = normal  Have you ever been in a relationship where you felt threatened, hurt or afraid?No   Does patient need assistance? Functional Status Self care Ambulation Normal   Primary Care Provider:  Clerance Lav MD  CC:  PATIENT IS HERE FOR PAP SMEAR/ ???LAST PAP DONE IS OCT- PER PATIET / VAG DISCHARGE.  History of Present Illness: Pt is a 25 yo AAF with PMH of asthma who came here for vaginal discharge, for last 5 days, foul smelling and some itching in vaginal area. She also has developed boils in her arm pits and it hurts at times.  She denies fevers, chills, sweating, high risk sexual behavior, discharge from the boils. She also denies any urinary complains or bowel habit change.   Preventive Screening-Counseling & Management  Alcohol-Tobacco     Alcohol drinks/day: <1     Alcohol type: mixed drinks     Smoking Status: never     Smoking Cessation Counseling: yes     Passive Smoke Exposure: yes  Caffeine-Diet-Exercise     Does Patient Exercise: no  Allergies (verified): 1)  ! Amoxicillin (Amoxicillin) 2)  ! Bactrim Ds (Sulfamethoxazole-Trimethoprim) 3)  ! * Tomato  Past History:  Past Medical History: Last updated: 09/27/2007 ASTHMA ECZEMA- Atopic dermatitis ASCUS    - 12/08 - refer back to gyn    -3/08 - s/p colposcopy  Past Surgical History: Last updated:  09/27/2007 NONE  Family History: Last updated: 02/06/2007 Mother alive age 10 unknown health history. Grandmother with diabetes. 2 brothers and 1 sister all in good health.  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   Risk Factors: Alcohol Use: <1 (09/16/2010) Exercise: no (09/16/2010)  Risk Factors: Smoking Status: never (09/16/2010) Passive Smoke Exposure: yes (09/16/2010)  Review of Systems      See HPI  Physical Exam  General:  alert, well-developed, well-nourished, and well-hydrated.   Head:  Normocephalic and atraumatic without obvious abnormalities. No apparent alopecia or balding. Eyes:  Right eye vision grossly intact, pupils equal, pupils round, pupils reactive to light, and conjunctival injection.   Ears:  left ear is full. buldging of TM. no exudate or perforation.  Nose:  no nasal discharge, mucosal erythema, and mucosal edema.   Mouth:  pharynx pink and moist, no exudates, and pharyngeal erythema.   Neck:  supple.   Chest Wall:  axillary area on right side has three boils, each of about 1/2 cm size, firm and not pastulant. sorrounding area erythema and mild to moderate pain upon palpation. there is a single 1 cm size firm lymphnode in anterior chain.  Lungs:  normal respiratory effort, normal breath sounds,  no crackles, and no wheezes.   Heart:  normal rate, regular rhythm, no murmur, and no JVD.   Abdomen:  soft, non-tender, normal bowel sounds, and no distention.   Genitalia:  normal introitus, mucosa pink and moist, no vaginal or cervical lesions, no friaility or hemorrhage, no adnexal masses or tenderness Vaginal discharge, white cruddy, Msk:  normal ROM, no joint tenderness, no joint swelling, no joint warmth, and no redness over joints.   Neurologic:  alert & oriented X3 and gait normal.   Psych:  Cognition and judgment appear intact. Alert and cooperative with  normal attention span and concentration. No apparent delusions, illusions, hallucinations   Impression & Recommendations:  Problem # 1:  VAGINAL DISCHARGE (ICD-623.5) Pt likely has candidial infection but she has a history of BV. We will reexamine her vaginal discharge for GC and chlamydia. Treat when the results are available. Advised on high risk sexual behavior and check for possible other STDs.   Orders: T-Syphilis Test (RPR) [id number]) T-Wet Prep by Molecular Probe [phone]) T-Culture, Giardia / Cryptosporidium ([id number]) T-HIV Antibody  (Reflex) ([id number])  Problem # 2:  FURUNCLE, RECURRENT (ICD-680.9) recurrent furuncles likely from nasal colonisation. She had much less recurrence in last few months, when she used bactroban for nose. Given her physical finding, likely MSSA or MRSA infection. Will treat with bactrim, and localised hot compression.   Problem # 3:  ASCUS PAP (ICD-795.01) [facility] october.   Problem # 4:  CONJUNCTIVITIS (ICD-372.30) resolved. no recurrence.  The following medications were removed from the medication list:    Ciprofloxacin Hcl 0.3 % Soln (Ciprofloxacin hcl) .Marland Kitchen... 1-2 drio to the affected eye four time a day for 7 days.  Problem # 5:  ASTHMA (ICD-493.90) well controlled. no exaceberations noted. Does not use rescue inhaler >1 per week.  Her updated medication list for this problem includes:    Advair Diskus 250-50 Mcg/dose Misc (Fluticasone-salmeterol) ..... Inhale one puff two times a day.    Proventil Hfa 108 (90 Base) Mcg/act Aers (Albuterol sulfate) ..... Inhale 1-2 puffs every 4 hours as needed  Complete Medication List: 1)  Advair Diskus 250-50 Mcg/dose Misc (Fluticasone-salmeterol) .... Inhale one puff two times a day. 2)  Proventil Hfa 108 (90 Base) Mcg/act Aers (Albuterol sulfate) .... Inhale 1-2 puffs every 4 hours as needed 3)  Nasonex 50 Mcg/act Susp (Mometasone furoate) .... 2  sprays in each nostril daily 4)  Prempro 0.625-2.5 Mg Tabs (Conj estrog-medroxyprogest ace) .... Take 1 tablet by mouth once a day 5)  Triamcinolone Acetonide 0.1 % Crea (Triamcinolone acetonide) .... Apply to affected are in thin layer 1-2 times a day for total of 14 days 6)  Zyrtec Allergy 10 Mg Tabs (Cetirizine hcl) .... Take 1 tablet by mouth once a day start after done with zyrtec d 7)  Bactroban 2 % Oint (Mupirocin) .... Apply in each nasal cavity with q tip each night for 7-10 days 8)  Bactrim Ds 800-160 Mg Tabs (Sulfamethoxazole-trimethoprim) .... Take 1 tablet by mouth two times a day  Patient Instructions: 1)  Please schedule a follow-up appointment in 1 month. 2)  Apply bactroban to both nostrils twice a day for five days.  3)  Apply hot compression to the boil area in arm pits. 4)  Take oral bactrim ds twice a day for 10 days.  Prescriptions: BACTRIM DS 800-160 MG TABS (SULFAMETHOXAZOLE-TRIMETHOPRIM) Take 1 tablet by mouth two times a day  #20 x 0  Entered and Authorized by:   Clerance Lav MD   Signed by:   Clerance Lav MD on 09/16/2010   Method used:   Electronically to        [facility] (retail)       [address]       [place], [place]  [address]       Ph: [phone]       Fax: [phone]   RxID:   [id number]    Orders Added: 1)  T-Syphilis Test (RPR) [id number] 2)  T-Wet Prep by Molecular Probe [phone] 3)  T-Culture, Giardia / Cryptosporidium [[id number]] 4)  T-HIV Antibody  (Reflex) [[id number]] 5)  Est. Patient Level IV [37106]   Process Orders Check Orders Results:     [facility]: ABN not required for this insurance Tests Sent for requisitioning (September 16, 2010 5:19 PM):     09/16/2010: [facility] -- T-Syphilis Test (RPR) [id number] (signed)     09/16/2010: [facility] -- T-Wet Prep by Molecular Probe [phone] (signed)     09/16/2010: [facility] -- T-Culture, Giardia / Cryptosporidium [[id number]] (signed)     09/16/2010: [facility] -- T-HIV Antibody  (Reflex) [[id number]] (signed)     Prevention & Chronic Care Immunizations   Influenza vaccine: Fluvax Non-MCR  (08/31/2010)   Influenza vaccine deferral: Deferred  (01/14/2010)    Tetanus booster: Not documented   Td booster deferral: Deferred  (09/16/2010)    Pneumococcal vaccine: Not documented  Other Screening   Pap smear: No malignancy.   (07/18/2010)   Smoking status: never  (09/16/2010)   Appended Document: Orders Update    Clinical Lists Changes  Orders: Added new Test order of T-Chlamydia & GC Probe, Genital ([id number]) - Signed      Process Orders Check Orders Results:     [facility]: ABN not required for this insurance Order queued for requisitioning for [facility]: September 20, 2010 3:30 PM Tests Sent for requisitioning (September 20, 2010 3:30 PM):     09/19/2010: [facility] -- T-Chlamydia & GC Probe, Genital [[id number]] (signed)

## 2010-11-15 NOTE — Progress Notes (Signed)
Summary: Refill/gh  Phone Note Refill Request Message from:  Patient on May 04, 2010 4:33 PM  Refills Requested: Medication #1:  PROVENTIL HFA 108 (90 BASE) MCG/ACT  AERS Inhale 1-2 puffs every 4 hours as needed Will need a new prescription since it was discontinued.  Call to [facility] new script needed with refills.  Will come from [facility].  Will be able to provide pt with a sample until her med comes in.   Method Requested: Electronic Initial call taken by: Angelina Ok RN,  May 04, 2010 4:33 PM  Follow-up for Phone Call        Refill approved-nurse to complete Follow-up by: Clerance Lav MD,  May 04, 2010 10:15 PM    Prescriptions: PROVENTIL HFA 108 (90 BASE) MCG/ACT  AERS (ALBUTEROL SULFATE) Inhale 1-2 puffs every 4 hours as needed  #1 x 11   Entered and Authorized by:   Clerance Lav MD   Signed by:   Clerance Lav MD on 05/04/2010   Method used:   Telephoned to ...       [facility] (retail)       [address] [place], [place]  [address]       Ph: [phone]       Fax: [phone]   RxID:   [id number]

## 2010-11-15 NOTE — Progress Notes (Signed)
Summary: phone/gg    Phone Note Call from Patient   Caller: Patient Summary of Call: Pt started on BACTRIM DS 800-160 MG TABS on Friday.  She  started having itching on arms and legs and lips are itching/feel tingly. She has not taked med today.  today her arms are still itching.  Has not tried benadryl.   Denies SOB or other symptoms. Please advise and change med?  Pt #[phone] Initial call taken by: Merrie Roof RN,  January 17, 2010 12:46 PM  Follow-up for Phone Call        Review of Dr. Margaretmary Eddy note suggests she had furunclosis which was treated with Bactrim DS.  Will stop the Bactrim and start Doxycycline 100 mg by mouth two times a day X 7 days.  She should also take over the counter benadryl acutely for the itching and lip tingling.  If this should worsen she should be seen in the ED immediately.  Bactrim was entered as an allergy.  Please call Ms. Lamaster to let her know a new prescription was faxed to her Pharmacy, that she is to avoid alcohol and direct sunlight while on the doxycyline, and that she should continue to use two forms of contraception while on the antibiotic.  Thank You. Follow-up by: Doneen Poisson MD,  January 17, 2010 1:50 PM  Additional Follow-up for Phone Call Additional follow up Details #1::        Pt has been informed of above. Patient/caller verbalizes understanding of these instructions.  Additional Follow-up by: Merrie Roof RN,  January 17, 2010 2:44 PM   New Allergies: ! BACTRIM DS (SULFAMETHOXAZOLE-TRIMETHOPRIM) New/Updated Medications: DOXYCYCLINE HYCLATE 100 MG CAPS (DOXYCYCLINE HYCLATE) take 1 capsule twice a day for 1 week New Allergies: ! BACTRIM DS (SULFAMETHOXAZOLE-TRIMETHOPRIM)Prescriptions: DOXYCYCLINE HYCLATE 100 MG CAPS (DOXYCYCLINE HYCLATE) take 1 capsule twice a day for 1 week  #14 x 0   Entered and Authorized by:   Doneen Poisson MD   Signed by:   Doneen Poisson MD on 01/17/2010   Method used:   Faxed to ...       [facility] (retail)       [address] [place], [place]  [address]       Ph: [phone]       Fax: [phone]   RxID:   [id number]

## 2010-11-15 NOTE — Progress Notes (Signed)
Summary: vaginal disch/ hla  Phone Note Call from Patient   Summary of Call: pt calls c/o vag disch, has self treated for yeast, disch continues, no appts available, instructed to go to [facility] [place] or [facility]. pt denies fever, abd pain, any/all other symptoms. she is agreeable w/ going to [facility] Initial call taken by: Marin Roberts RN,  June 23, 2010 12:30 PM

## 2010-11-30 ENCOUNTER — Ambulatory Visit (INDEPENDENT_AMBULATORY_CARE_PROVIDER_SITE_OTHER): Payer: Medicaid Other | Admitting: Obstetrics and Gynecology

## 2010-11-30 DIAGNOSIS — Z3049 Encounter for surveillance of other contraceptives: Secondary | ICD-10-CM

## 2010-12-26 LAB — URINALYSIS, ROUTINE W REFLEX MICROSCOPIC
Glucose, UA: NEGATIVE mg/dL
Ketones, ur: NEGATIVE mg/dL
Protein, ur: NEGATIVE mg/dL
Specific Gravity, Urine: 1.025 (ref 1.005–1.030)
pH: 8 (ref 5.0–8.0)

## 2010-12-26 LAB — WET PREP, GENITAL
Trich, Wet Prep: NONE SEEN
Yeast Wet Prep HPF POC: NONE SEEN

## 2010-12-26 LAB — URINE MICROSCOPIC-ADD ON

## 2010-12-28 LAB — URINE CULTURE
Colony Count: >=100000
Culture  Setup Time: 201110181914

## 2010-12-28 LAB — URINALYSIS, ROUTINE W REFLEX MICROSCOPIC
Nitrite: POSITIVE — AB
Protein, ur: NEGATIVE mg/dL
Urobilinogen, UA: 0.2 mg/dL (ref 0.0–1.0)

## 2010-12-28 LAB — URINE MICROSCOPIC-ADD ON

## 2010-12-28 LAB — GC/CHLAMYDIA PROBE AMP, GENITAL
Chlamydia, DNA Probe: NEGATIVE
GC Probe Amp, Genital: NEGATIVE

## 2010-12-28 LAB — WET PREP, GENITAL
Trich, Wet Prep: NONE SEEN
Yeast Wet Prep HPF POC: NONE SEEN

## 2010-12-28 LAB — POCT PREGNANCY, URINE: Preg Test, Ur: NEGATIVE

## 2010-12-29 LAB — WET PREP, GENITAL: Trich, Wet Prep: NONE SEEN

## 2010-12-29 LAB — GC/CHLAMYDIA PROBE AMP, GENITAL: GC Probe Amp, Genital: NEGATIVE
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
## 2011-01-19 LAB — GC/CHLAMYDIA PROBE AMP, GENITAL: Chlamydia, DNA Probe: NEGATIVE

## 2011-01-19 LAB — WET PREP, GENITAL: Yeast Wet Prep HPF POC: NONE SEEN

## 2011-01-19 LAB — POCT URINALYSIS DIP (DEVICE)
Glucose, UA: NEGATIVE mg/dL
pH: 6 (ref 5.0–8.0)

## 2011-01-19 LAB — POCT PREGNANCY, URINE: Preg Test, Ur: NEGATIVE

## 2011-01-25 ENCOUNTER — Other Ambulatory Visit: Payer: Self-pay | Admitting: *Deleted

## 2011-01-25 MED ORDER — FLUTICASONE-SALMETEROL 250-50 MCG/DOSE IN AEPB: 1.0000 | INHALATION_SPRAY | Freq: Two times a day (BID) | RESPIRATORY_TRACT | Status: DC

## 2011-01-25 NOTE — Telephone Encounter (Signed)
Advair Rx faxed to [facility].

## 2011-02-02 ENCOUNTER — Other Ambulatory Visit: Payer: Self-pay | Admitting: *Deleted

## 2011-02-02 MED ORDER — TRIAMCINOLONE ACETONIDE 0.1 % EX CREA: TOPICAL_CREAM | CUTANEOUS | Status: DC

## 2011-02-16 ENCOUNTER — Ambulatory Visit: Payer: Medicaid Other

## 2011-02-20 ENCOUNTER — Ambulatory Visit (INDEPENDENT_AMBULATORY_CARE_PROVIDER_SITE_OTHER): Payer: Medicaid Other

## 2011-02-20 DIAGNOSIS — Z3049 Encounter for surveillance of other contraceptives: Secondary | ICD-10-CM

## 2011-03-17 ENCOUNTER — Ambulatory Visit (INDEPENDENT_AMBULATORY_CARE_PROVIDER_SITE_OTHER): Payer: Self-pay | Admitting: Internal Medicine

## 2011-03-17 ENCOUNTER — Encounter: Payer: Self-pay | Admitting: Internal Medicine

## 2011-03-17 VITALS — BP 113/78 | HR 77 | Temp 93.0°F | Ht 62.0 in | Wt 115.0 lb

## 2011-03-17 DIAGNOSIS — H109 Unspecified conjunctivitis: Secondary | ICD-10-CM

## 2011-03-17 MED ORDER — KETOTIFEN FUMARATE 0.025 % OP SOLN: 1.0000 [drp] | Freq: Two times a day (BID) | OPHTHALMIC | Status: AC

## 2011-03-17 MED ORDER — POLYETHYL GLYCOL-POLYVINYL ALC 1-1 % OP SOLN: 2.0000 [drp] | Freq: Every day | OPHTHALMIC | Status: DC

## 2011-03-17 NOTE — Patient Instructions (Signed)
Follow up in 2 weeks if symptoms not resolved Cold compression on eyes Artifical tear drops twice a day A new eye drop twice a day to reduce allergy symptoms    Allergic Conjunctivitis The conjunctiva is a thin membrane that covers the visible white part of the eyeball and the underside of the eyelids. This membrane protects and lubricates the eye. The membrane has small blood vessels running through it that can normally be seen. When the conjunctiva becomes inflamed, the condition is called conjunctivitis. In response to the inflammation, the conjunctival blood vessels become swollen. The swelling results in redness in the normally white part of the eye. The blood vessels of this membrane also react when a person has allergies and is then called allergic conjunctivitis. This condition usually lasts for as long as the allergy persists. Allergic conjunctivitis cannot be passed to another person (non-contagious). The likelihood of bacterial infection is great and the cause is not likely due to allergies if the inflamed eye has:  A sticky discharge.  Discharge or sticking together of the lids in the morning.   Scaling or flaking of the eyelids where the eyelashes come out.   Red swollen eyelids.   CAUSES  Germs (bacteria).   Viruses.   Irritants such as foreign bodies.   Blunt injury.   Chemicals.   General allergic reactions.   Inflammation or serious diseases in the inside or the outside of the eye or the orbit (the boney cavity in which the eye sits) can cause a "red eye."  SYMPTOMS  Eye redness.   Tearing.   Itchy eyes.   Burning feeling in the eyes.   Clear drainage from the eye.   Allergic reaction due to pollens or ragweed sensitivity. Seasonal allergic conjunctivitis is frequent in the spring when pollens are in the air and in the fall.  DIAGNOSIS This condition, in its many forms, is usually diagnosed based on the history and an ophthalmological exam. It usually  involves both eyes. If your eyes react at the same time every year, allergies may be the cause. While most "red eyes" are due to allergy or an infection, the role of an eye (ophthalmological) exam is important. The exam can rule out serious diseases of the eye or orbit. TREATMENT  Non-antibiotic eye drops, ointments, or medications by mouth may be prescribed if the ophthalmologist is sure the conjunctivitis is due to allergies alone.   Over-the-counter drops and ointments for allergic symptoms should be used only after other causes of conjunctivitis have been ruled out, or as your caregiver suggests.  Medications by mouth are often prescribed if other allergy-related symptoms are present. If the ophthalmologist is sure that the conjunctivitis is due to allergies alone, treatment is normally limited to drops or ointments to reduce itching and burning. HOME CARE INSTRUCTIONS  Wash hands before and after applying drops or ointments, or touching the inflamed eye(s) or eyelids.   Stop using your soft contact lenses and throw them away. Use a new pair of lenses when recovery is complete. You should run through sterilizing cycles at least three times before use after complete recovery if the old soft contact lenses are to be used. Hard contact lenses should be stopped. They need to be thoroughly sterilized before use after recovery.   Do not let the eye dropper tip or ointment tube touch the eyelid when putting medicine in your eye.   Itching and burning eyes due to allergies is often relieved by using a cool  cloth applied to closed eye(s).  SEEK MEDICAL CARE IF:   Your problems do not go away after two or three days of treatment.   Your lids are sticky (especially in the morning when you wake up) or stick together.   Discharge develops. Antibiotics may be needed either as drops, ointment, or by mouth.   You have extreme light sensitivity.   An oral temperature above 101 develops.   Pain in or  around the eye or any other visual symptom develops.  MAKE SURE YOU:   Understand these instructions.   Will watch your condition.   Will get help right away if you are not doing well or get worse.  Document Released: 12/23/2002 Document Re-Released: 03/22/2010 [facility] Patient Information 2011 [place], [place].

## 2011-03-17 NOTE — Progress Notes (Signed)
  Subjective:    Patient ID: Sandra Oneill, female    DOB: [birth year], 25 y.o.   MRN: [id number]  HPI 25 years old female presents with itchy, red watery eyes. She reports that the problem is ongoing for a month now. She denies any loss of vision acuity. She reports sticky eyes in early morning and yellow white encrustation. She denies runny nose, exposure to pets, sick children, other new environmental factors.  She never had similar complaints before. Her asthma is not flaring up. She has not been diagnosed with glaucoma before. Her color perception is intact.    Review of Systems  Constitutional: Negative for fever, chills, activity change and appetite change.  HENT: Positive for ear discharge. Negative for nosebleeds, facial swelling, neck pain and tinnitus.   Eyes: Positive for photophobia, pain, discharge, redness and itching. Negative for visual disturbance.  Respiratory: Negative for cough, chest tightness and shortness of breath.   Cardiovascular: Negative for chest pain and palpitations.  Gastrointestinal: Negative for nausea, vomiting, abdominal pain, blood in stool and abdominal distention.  Skin: Negative for rash.  Neurological: Negative for dizziness, seizures, weakness and headaches.  Psychiatric/Behavioral: Negative for suicidal ideas, confusion and agitation.       Objective:   Physical Exam  Constitutional: She is oriented to person, place, and time. She appears well-developed and well-nourished.  HENT:  Head: Normocephalic and atraumatic.  Right Ear: External ear normal.  Left Ear: External ear normal.  Nose: Mucosal edema and rhinorrhea present.  Eyes: EOM are normal. Pupils are equal, round, and reactive to light. Right eye exhibits discharge and hordeolum. Left eye exhibits discharge and hordeolum. Right conjunctiva is injected. Left conjunctiva is injected.  Neck: No JVD present. No tracheal deviation present. No thyromegaly present.  Cardiovascular: Normal  rate, regular rhythm and normal heart sounds.  Exam reveals no gallop.   No murmur heard. Pulmonary/Chest: No respiratory distress. She has no wheezes. She has no rales. She exhibits no tenderness.  Abdominal: Soft. Bowel sounds are normal. She exhibits no distension and no mass. There is no tenderness. There is no rebound and no guarding.  Musculoskeletal: Normal range of motion. She exhibits no edema and no tenderness.  Lymphadenopathy:    She has no cervical adenopathy.  Neurological: She is alert and oriented to person, place, and time. She has normal reflexes. No cranial nerve deficit. Coordination normal.  Skin: No rash noted. No erythema.  Psychiatric: She has a normal mood and affect. Her behavior is normal. Thought content normal.          Assessment & Plan:

## 2011-03-17 NOTE — Assessment & Plan Note (Signed)
Allergic conjuctivitis worsened due to pollen exposure. Prescribed antihistmaine eye drops and artificial tears. Advised cold compression and how to avoid allergens. Pt voices understanding.

## 2011-04-10 ENCOUNTER — Other Ambulatory Visit: Payer: Self-pay | Admitting: *Deleted

## 2011-04-10 MED ORDER — FLUTICASONE-SALMETEROL 250-50 MCG/DOSE IN AEPB: 1.0000 | INHALATION_SPRAY | Freq: Two times a day (BID) | RESPIRATORY_TRACT | Status: DC

## 2011-04-10 NOTE — Telephone Encounter (Signed)
Advair rx refilled - request form faxed to [facility].

## 2011-05-08 ENCOUNTER — Ambulatory Visit (INDEPENDENT_AMBULATORY_CARE_PROVIDER_SITE_OTHER): Payer: Self-pay

## 2011-05-08 VITALS — BP 116/80 | HR 88

## 2011-05-08 DIAGNOSIS — IMO0001 Reserved for inherently not codable concepts without codable children: Secondary | ICD-10-CM

## 2011-05-08 DIAGNOSIS — Z309 Encounter for contraceptive management, unspecified: Secondary | ICD-10-CM

## 2011-05-08 MED ORDER — MEDROXYPROGESTERONE ACETATE 150 MG/ML IM SUSP
150.0000 mg | INTRAMUSCULAR | Status: AC
  Administered 2011-05-08 – 2011-08-02 (×2): 150 mg via INTRAMUSCULAR

## 2011-05-08 MED ORDER — MEDROXYPROGESTERONE ACETATE 150 MG/ML IM SUSP: 150.0000 mg | Freq: Once | INTRAMUSCULAR | Status: DC

## 2011-05-16 ENCOUNTER — Other Ambulatory Visit: Payer: Self-pay | Admitting: *Deleted

## 2011-05-16 MED ORDER — ALBUTEROL SULFATE HFA 108 (90 BASE) MCG/ACT IN AERS: INHALATION_SPRAY | RESPIRATORY_TRACT | Status: DC

## 2011-05-16 NOTE — Telephone Encounter (Signed)
Albuterol is not on her med list.  Who prescribed this and when?

## 2011-05-16 NOTE — Telephone Encounter (Signed)
Faxed to the [facility]. 

## 2011-05-16 NOTE — Telephone Encounter (Signed)
Inhaler was ordered/started back in  8/42009 by Dr. Reynold Bowen.  Last refill was 02/24/2011 #1 inhaler.  Sheet said Dr. Sherryll Burger.

## 2011-06-21 ENCOUNTER — Telehealth: Payer: Self-pay | Admitting: *Deleted

## 2011-06-21 ENCOUNTER — Inpatient Hospital Stay (INDEPENDENT_AMBULATORY_CARE_PROVIDER_SITE_OTHER)
Admission: RE | Admit: 2011-06-21 | Discharge: 2011-06-21 | Disposition: A | Discharge status: Home / Self Care | Payer: Self-pay | Source: Ambulatory Visit | Attending: Family Medicine | Admitting: Family Medicine

## 2011-06-21 DIAGNOSIS — L738 Other specified follicular disorders: Secondary | ICD-10-CM

## 2011-06-21 NOTE — Telephone Encounter (Signed)
Pt calls c/o of nodule on L side of scalp since Friday now draining "pus". She states it is painful and desires an appt, denies fevers. appt set for 9/6 at 1015 per sharonb.

## 2011-06-21 NOTE — Telephone Encounter (Signed)
Thanks.  Sandra Oneill. 

## 2011-06-22 ENCOUNTER — Ambulatory Visit: Payer: Self-pay | Admitting: Internal Medicine

## 2011-07-04 ENCOUNTER — Other Ambulatory Visit: Payer: Self-pay | Admitting: *Deleted

## 2011-07-06 LAB — POCT URINALYSIS DIP (DEVICE)
Operator id: 116391
Protein, ur: 30 — AB
Specific Gravity, Urine: 1.02
Urobilinogen, UA: 1

## 2011-07-06 LAB — POCT PREGNANCY, URINE: Preg Test, Ur: NEGATIVE

## 2011-07-06 MED ORDER — FLUTICASONE-SALMETEROL 250-50 MCG/DOSE IN AEPB: 1.0000 | INHALATION_SPRAY | Freq: Two times a day (BID) | RESPIRATORY_TRACT | Status: DC

## 2011-07-07 ENCOUNTER — Other Ambulatory Visit: Payer: Self-pay | Admitting: *Deleted

## 2011-07-07 MED ORDER — ALBUTEROL SULFATE HFA 108 (90 BASE) MCG/ACT IN AERS: INHALATION_SPRAY | RESPIRATORY_TRACT | Status: DC

## 2011-07-07 MED ORDER — TRIAMCINOLONE ACETONIDE 0.1 % EX CREA: TOPICAL_CREAM | CUTANEOUS | Status: DC

## 2011-07-07 NOTE — Telephone Encounter (Signed)
Advair rx refill - request form fax to [facility].

## 2011-07-11 LAB — POCT PREGNANCY, URINE
Operator id: 239701
Preg Test, Ur: NEGATIVE

## 2011-07-11 LAB — POCT URINALYSIS DIP (DEVICE)
Bilirubin Urine: NEGATIVE
Hgb urine dipstick: NEGATIVE
Ketones, ur: NEGATIVE
pH: 7

## 2011-07-11 LAB — GC/CHLAMYDIA PROBE AMP, GENITAL
Chlamydia, DNA Probe: NEGATIVE
GC Probe Amp, Genital: NEGATIVE

## 2011-07-11 LAB — WET PREP, GENITAL
Clue Cells Wet Prep HPF POC: NONE SEEN
Trich, Wet Prep: NONE SEEN
Yeast Wet Prep HPF POC: NONE SEEN

## 2011-07-13 NOTE — Telephone Encounter (Signed)
Faxed to the [facility]. 

## 2011-07-17 LAB — POCT URINALYSIS DIP (DEVICE)
Hgb urine dipstick: NEGATIVE
Protein, ur: 30 — AB
Specific Gravity, Urine: 1.02
Urobilinogen, UA: 0.2

## 2011-07-21 LAB — POCT URINALYSIS DIP (DEVICE)
Glucose, UA: NEGATIVE mg/dL
Hgb urine dipstick: NEGATIVE
Protein, ur: NEGATIVE mg/dL
Specific Gravity, Urine: 1.015 (ref 1.005–1.030)
Urobilinogen, UA: 0.2 mg/dL (ref 0.0–1.0)

## 2011-07-21 LAB — GC/CHLAMYDIA PROBE AMP, GENITAL
Chlamydia, DNA Probe: POSITIVE — AB
GC Probe Amp, Genital: NEGATIVE

## 2011-07-21 LAB — WET PREP, GENITAL: Trich, Wet Prep: NONE SEEN

## 2011-07-25 ENCOUNTER — Ambulatory Visit (INDEPENDENT_AMBULATORY_CARE_PROVIDER_SITE_OTHER): Payer: Self-pay

## 2011-07-25 DIAGNOSIS — Z23 Encounter for immunization: Secondary | ICD-10-CM

## 2011-07-27 LAB — POCT URINALYSIS DIP (DEVICE)
Nitrite: POSITIVE — AB
Protein, ur: >=300 — AB
Urobilinogen, UA: 0.2
pH: 6

## 2011-07-28 ENCOUNTER — Ambulatory Visit: Payer: Self-pay

## 2011-07-31 LAB — POCT URINALYSIS DIP (DEVICE)
Bilirubin Urine: NEGATIVE
Glucose, UA: NEGATIVE
Ketones, ur: NEGATIVE
Operator id: 116391
Protein, ur: NEGATIVE

## 2011-07-31 LAB — POCT PREGNANCY, URINE: Operator id: 116391

## 2011-07-31 LAB — WET PREP, GENITAL
Trich, Wet Prep: NONE SEEN
Yeast Wet Prep HPF POC: NONE SEEN

## 2011-08-02 ENCOUNTER — Ambulatory Visit (INDEPENDENT_AMBULATORY_CARE_PROVIDER_SITE_OTHER): Payer: Self-pay | Admitting: *Deleted

## 2011-08-02 VITALS — BP 130/87 | HR 76

## 2011-08-02 DIAGNOSIS — Z3049 Encounter for surveillance of other contraceptives: Secondary | ICD-10-CM

## 2011-08-03 LAB — POCT PREGNANCY, URINE: Operator id: 194561

## 2011-08-23 ENCOUNTER — Encounter: Payer: Self-pay | Admitting: Internal Medicine

## 2011-08-23 ENCOUNTER — Ambulatory Visit (INDEPENDENT_AMBULATORY_CARE_PROVIDER_SITE_OTHER): Payer: Self-pay | Admitting: Internal Medicine

## 2011-08-23 VITALS — BP 123/77 | HR 92 | Temp 99.1°F | Ht 62.0 in | Wt 121.1 lb

## 2011-08-23 DIAGNOSIS — R5383 Other fatigue: Secondary | ICD-10-CM

## 2011-08-23 DIAGNOSIS — R209 Unspecified disturbances of skin sensation: Secondary | ICD-10-CM

## 2011-08-23 DIAGNOSIS — R200 Anesthesia of skin: Secondary | ICD-10-CM | POA: Insufficient documentation

## 2011-08-23 DIAGNOSIS — R5381 Other malaise: Secondary | ICD-10-CM

## 2011-08-23 DIAGNOSIS — R202 Paresthesia of skin: Secondary | ICD-10-CM | POA: Insufficient documentation

## 2011-08-23 MED ORDER — PROMETHAZINE HCL 25 MG PO TABS: ORAL_TABLET | ORAL | Status: DC

## 2011-08-23 NOTE — Patient Instructions (Signed)
Cerebral Aneurysm A cerebral aneurysm is the bulging or ballooning out of part of the wall of a vein or artery in the brain. CAUSES Common causes include:   Congenital (present since birth) defects.   High blood pressure.   The build-up of fatty deposits in the arteries (atherosclerosis).   Blood vessels that develop abnormally.   Diseases that cause weakening and damage to the walls of blood vessels.  Uncommon causes include:  Head trauma (damage caused by an accident).   Infection.   Tumors.   Drug abuse (mostly from cocaine, heroin, and amphetamine use).  Cerebral aneurysms can occur at any age. They are more common in adults than in children. They and are slightly more common in women than in men.  SYMPTOMS  The signs and symptoms of an unruptured cerebral aneurysm will partly depend on its size and rate of growth.  A small, unchanging aneurysm will generally produce no symptoms. A larger aneurysm that is steadily growing may produce symptoms such as headache, neck stiffness or pain, loss of feeling in the face or problems with the eyes.  If an aneurysm bursts, the problem can be life-threatening. Symptoms may include:  A sudden and usually severe headache.   Neck stiffness or pain.   Confusion and/or drowsiness.   Problems speaking.   Weakness in an arm and/or a leg.   Nausea (feeling sick to your stomach).   Vision impairment.   Vomiting.   Loss of consciousness.  Rupture of a cerebral aneurysm results in bleeding in the brain, causing a stroke. Or, blood can leak into the area around the brain and develop into a blood clot within the skull. More problems can occur as a result of the aneurysm breaking. These include:  Re-bleeding.   Hydrocephalus (an increase in normal brain fluid in the chambers inside the brain).   Vasospasm (blood vessels decrease in size and starve the brain of nutrients and oxygen).  TREATMENT  Emergency treatment for a ruptured  cerebral aneurysm generally includes restoring breathing, and reducing pressure inside the head. Immediate emergency surgery may be recommended to help prevent damage caused by hydrocephalus and to reduce the risk of re-bleeding.  When aneurysms are discovered before rupture occurs, microcoil thrombosis or balloon embolization may be performed on patients for whom surgery is considered too risky. During these procedures, a thin, hollow tube (catheter) is inserted through an artery to travel up to the brain. Once the catheter reaches the aneurysm, tiny balloons or coils are used to block blood flow through the aneurysm. Other treatments may include:  Bed rest.   Drug therapy.   Hypertensive-hypervolemic therapy (which elevates blood pressure, increases blood volume, and thins the blood) to drive blood flow through and around blocked arteries and control vasospasm.  PROGNOSIS  The prognosis for a patient with a ruptured cerebral aneurysm depends on:  The extent and location of the aneurysm.   The person's age.   General health.   Neurological condition.  Some people with a ruptured cerebral aneurysm die from the initial bleeding. Others recover with little or no problems. Early diagnosis and treatment are important. Document Released: 06/24/2002 Document Revised: 06/14/2011 Document Reviewed: 09/03/2007     Multiple sclerosis (MS) is a disease of the central nervous system. Its cause is unknown. It is more common in the [place] states than in the [place] states. There is a higher incidence of MS in women. There is a wide variation in the symptoms (problems) of MS. This is  because of the many different ways it affects the central nervous system. It often comes on in episodes or attacks. These attacks may last weeks to months. There may be long periods of nearly no problems between attacks. The main symptoms include visual problems (associated with eye pain), numbness, weakness, and paralysis  in extremities (arms/hands and legs/feet). There may also be tremors and problems with balance and walking. The age when MS starts is variable. Advances in medicine continue to improve the treatment of this illness. There is no known cure for MS but there are medications that help. MS is not an inherited illness, although your risk of getting this disease is higher if you have a relative with MS. The best radiologic (x-ray) study for MS is an MRI (magnetic resonance imaging). There are medications available to decrease the number and frequency of attacks. SYMPTOMS  The symptoms of MS are caused by loss of insulation (myelin) of the nerves of the brain. When this happens, brain signals do not get transmitted properly or may not get transmitted at all. Some of the problems caused by this include:   Numbness.   Weakness.   Paralysis in extremities.   Visual problems, eye pain.   Balance problems.   Tremors.  DIAGNOSIS  Your caregiver can do studies on you to make this diagnosis. This may include specialized X-rays and spinal fluid studies. HOME CARE INSTRUCTIONS   Take medications as directed by your caregiver. Baclofen is a drug commonly used to reduce muscle spasticity. Steroids are often used for short term relief.   Exercise as directed.   Use physical and occupational therapy as directed by your caregiver. Careful attention to this medical care can help avoid depression.   See your caregiver if you begin to have problems with depression. This is a common problem in MS. Patients often continue to work many years after the diagnosis of MS.  Document Released: 09/29/2000 Document Revised: 06/14/2011 Document Reviewed: 05/08/2007 [facility] Patient Information 2012 [place], [place].

## 2011-08-23 NOTE — Progress Notes (Signed)
Subjective:    Patient ID: Sandra Oneill, female    DOB: 1985/11/28, 25 y.o.   MRN: [id number]  HPI  Patient is a 25 year old female with past medical history most significant for ventricular furuncle and asthma.  Patient is complaining of numbness and decreased sensation in her right leg for about one year. Patient also complains of dizziness which is described as a sensation of spinning of the room. She states that she has these episodes once every 2 days. The episodes last a few minutes. She has a feeling as if she is going to fall but she has never fell. Patient denies any exacerbating factors for dizziness. There are no relieving factors. There are associated ear pains/headaches associated with the dizziness spells. Patient denies any palpitations or aura. Patient complains of occasional nausea along with these episodes but she has never vomited. Patient denies double vision, gait imbalance or problems with hearing. Patient sees that she is sometimes scared that she would lose her balance and fall and also about having a dizziness spell in the car.  There is no family history of multiple sclerosis or vascular abnormalities.   Review of Systems  Constitutional: Negative for fever, activity change and appetite change.  HENT: Negative for sore throat.   Respiratory: Negative for cough and shortness of breath.   Cardiovascular: Negative for chest pain and leg swelling.  Gastrointestinal: Negative for nausea, abdominal pain, diarrhea, constipation and abdominal distention.  Genitourinary: Negative for frequency, hematuria and difficulty urinating.  Neurological: Negative for dizziness and headaches.  Psychiatric/Behavioral: Negative for suicidal ideas and behavioral problems.  All other systems reviewed and are negative.       Objective:   Physical Exam  Constitutional: She is oriented to person, place, and time. She appears well-developed and well-nourished.  HENT:  Head:  Normocephalic and atraumatic.  Right Ear: Hearing, tympanic membrane, external ear and ear canal normal.  Left Ear: Hearing, tympanic membrane, external ear and ear canal normal.  Eyes: Conjunctivae and EOM are normal. Pupils are equal, round, and reactive to light. No scleral icterus. Right eye exhibits normal extraocular motion and no nystagmus. Left eye exhibits normal extraocular motion and no nystagmus.  Neck: Normal range of motion. Neck supple. No JVD present. No thyromegaly present.  Cardiovascular: Normal rate, regular rhythm, normal heart sounds and intact distal pulses.  Exam reveals no gallop and no friction rub.   No murmur heard.      No bruit noted over carotid arteries.  Pulmonary/Chest: Effort normal and breath sounds normal. No respiratory distress. She has no wheezes. She has no rales.  Abdominal: Soft. Bowel sounds are normal. She exhibits no distension and no mass. There is no tenderness. There is no rebound and no guarding.  Musculoskeletal: Normal range of motion. She exhibits no edema and no tenderness.  Lymphadenopathy:    She has no cervical adenopathy.  Neurological: She is alert and oriented to person, place, and time. She has normal strength and normal reflexes. She displays no atrophy and no tremor. A sensory deficit is present. No cranial nerve deficit. She exhibits normal muscle tone. She displays a negative Romberg sign. She displays no seizure activity. Gait normal. GCS eye subscore is 4. GCS verbal subscore is 5. GCS motor subscore is 6.  Reflex Scores:      Tricep reflexes are 2+ on the right side and 2+ on the left side.      Bicep reflexes are 2+ on the right side and 2+ on  the left side.      Brachioradialis reflexes are 2+ on the right side and 2+ on the left side.      Patellar reflexes are 2+ on the right side and 2+ on the left side.      Achilles reflexes are 2+ on the right side and 2+ on the left side.      Patient had decreased sensation over entire  right leg as compared to right leg.  Psychiatric: She has a normal mood and affect. Her behavior is normal.          Assessment & Plan:

## 2011-08-24 NOTE — Assessment & Plan Note (Addendum)
After reviewing the past medical history, reviewing the chart in detail, patient's presenting symptoms today, physical exam findings and discussing with Dr. Aundria Rud, it was decided that we should get MRI and MRA of her head. The differential diagnoses includes multiple sclerosis and aneurysm.  I will follow up in 1 week after the MRI is done and make a plan regarding management based on the results.

## 2011-08-25 ENCOUNTER — Ambulatory Visit (HOSPITAL_COMMUNITY)
Admission: RE | Admit: 2011-08-25 | Discharge: 2011-08-25 | Disposition: A | Discharge status: Home / Self Care | Payer: Self-pay | Source: Ambulatory Visit | Attending: Internal Medicine | Admitting: Internal Medicine

## 2011-08-25 ENCOUNTER — Other Ambulatory Visit (HOSPITAL_COMMUNITY): Payer: Self-pay

## 2011-08-25 DIAGNOSIS — R110 Nausea: Secondary | ICD-10-CM | POA: Insufficient documentation

## 2011-08-25 DIAGNOSIS — R209 Unspecified disturbances of skin sensation: Secondary | ICD-10-CM | POA: Insufficient documentation

## 2011-08-25 DIAGNOSIS — R200 Anesthesia of skin: Secondary | ICD-10-CM

## 2011-08-25 DIAGNOSIS — J3489 Other specified disorders of nose and nasal sinuses: Secondary | ICD-10-CM | POA: Insufficient documentation

## 2011-08-25 MED ORDER — GADOBENATE DIMEGLUMINE 529 MG/ML IV SOLN
11.0000 mL | Freq: Once | INTRAVENOUS | Status: AC | PRN
  Administered 2011-08-25: 11 mL via INTRAVENOUS

## 2011-08-28 ENCOUNTER — Telehealth: Payer: Self-pay | Admitting: Internal Medicine

## 2011-08-28 NOTE — Telephone Encounter (Signed)
I discussed the results of MRI. Patient understands and is agreeable to set up an appointment if she is still symptomatic to look for other causes of her right leg weakness. I will forward this note and asked to set up an appointment.

## 2011-09-04 ENCOUNTER — Other Ambulatory Visit: Payer: Self-pay | Admitting: Internal Medicine

## 2011-09-04 ENCOUNTER — Telehealth: Payer: Self-pay | Admitting: *Deleted

## 2011-09-04 MED ORDER — MOMETASONE FUROATE 50 MCG/ACT NA SUSP: 2.0000 | Freq: Every day | NASAL | Status: DC

## 2011-09-04 NOTE — Telephone Encounter (Signed)
Done

## 2011-09-04 NOTE — Telephone Encounter (Signed)
Request refill on Nasonex - Uses 2 sprays in each nostril once daily - Qty 3 inhalers. Not on current med list but on list in E-Chart.  Last dispensed 06/12/11. Thanks

## 2011-09-04 NOTE — Telephone Encounter (Signed)
Nasones rx refill for 17g x 2 refills request form faxed to [facility].

## 2011-10-03 ENCOUNTER — Other Ambulatory Visit: Payer: Self-pay | Admitting: *Deleted

## 2011-10-03 MED ORDER — FLUTICASONE-SALMETEROL 250-50 MCG/DOSE IN AEPB: 1.0000 | INHALATION_SPRAY | Freq: Two times a day (BID) | RESPIRATORY_TRACT | Status: DC

## 2011-10-03 NOTE — Telephone Encounter (Signed)
Refills for Advair called to the [facility].  Angelina Ok, RN 10/03/2011 3:23 PM.

## 2011-10-05 ENCOUNTER — Ambulatory Visit (INDEPENDENT_AMBULATORY_CARE_PROVIDER_SITE_OTHER): Payer: Medicaid Other | Admitting: Internal Medicine

## 2011-10-05 ENCOUNTER — Encounter: Payer: Self-pay | Admitting: Internal Medicine

## 2011-10-05 VITALS — BP 111/75 | HR 87 | Temp 98.2°F | Ht 62.0 in | Wt 126.2 lb

## 2011-10-05 DIAGNOSIS — L2089 Other atopic dermatitis: Secondary | ICD-10-CM

## 2011-10-05 DIAGNOSIS — J45909 Unspecified asthma, uncomplicated: Secondary | ICD-10-CM

## 2011-10-05 DIAGNOSIS — R51 Headache: Secondary | ICD-10-CM | POA: Insufficient documentation

## 2011-10-05 DIAGNOSIS — R519 Headache, unspecified: Secondary | ICD-10-CM | POA: Insufficient documentation

## 2011-10-05 MED ORDER — NAPROXEN 250 MG PO TABS: 250.0000 mg | ORAL_TABLET | Freq: Two times a day (BID) | ORAL | Status: DC

## 2011-10-05 NOTE — Patient Instructions (Addendum)
   Return to clinic to see Dr. Candy Sledge in 2 months or call the clinic if you need to be seen sooner Please bring all your medications to your next clinic appointment.    Sleep Apnea Sleep apnea is a common disorder. The main problem of this disorder is excessive daytime sleepiness and compromised quality of life. This may include social and emotional problems. There are two types of sleep apnea.  Obstructive sleep apnea is when breathing stops due to a blocked airway.   Central sleep apnea is a malfunction of the brain's normal signal to breathe.  SYMPTOMS  Restless sleep.   Falling asleep while driving and/or during the day.   Loss of energy.   Irritability.   Mood or behavior changes.   Loud, heavy snoring.   Morning headaches.   Trouble concentrating.   Forgetfulness.   Anxiety or depression.   Decreased interest in sex.  Not all people with sleep apnea have all of these symptoms. However, people who have a few of these symptoms should visit their caregiver for an evaluation. Problems related to untreated sleep apnea include:  High blood pressure (hypertension).   Coronary artery disease.   Impotence.   Cognitive dysfunction.   Memory loss.  TREATMENT  For mild cases, treatment may include avoiding sleeping on one's back.   For people with nasal congestion, a decongestant may be prescribed.   Patients with obstructive and central apnea should avoid depressants. This includes alcohol, sedatives and narcotics. Weight loss and diet control are encouraged for overweight patients.   Many serious cases of obstructive sleep apnea can be relieved by a treatment called nasal continuous positive airway pressure (nasal CPAP). Nasal CPAP uses a mask-like device and pump that work together to keep the airway open. The pump delivers air pressure during each breath.   Surgery may help some patients by stopping or reducing the narrowing of the airway due to anatomical  defects.  PROGNOSIS  Removing the obstruction usually reverses hypertension and cardiac problems. Untreated, sleep apnea sufferers have a tendency to fall asleep during the day. This is can result in serious accident or loss of ones job. RESEARCH Sleep apnea is currently one of the most active areas of sleep research.  Document Released: 09/22/2002 Document Revised: 06/14/2011 Document Reviewed: 01/18/2006 [facility] Patient Information 2012 [place], [place].

## 2011-10-05 NOTE — Progress Notes (Signed)
Subjective:   Patient ID: Sandra Oneill female   DOB: 03/23/1986 25 y.o.   MRN: [id number]  HPI: SandraSandra Oneill is a 25 y.o. woman past medical history significant for asthma, eczema, and headaches x2 months. She presents for evaluation of her headaches.   Sandra Oneill is here for follow-up of her MRI. This was ordered by Dr. Eben Burow for evaluation of her headaches. She states she's continued to have headaches daily that her worst in the morning. She states they are located in her bitemporal region without radiation. 10 out of 10 sharp, shooting pains. She states the pain generally does better during the day. Is made better with Advil PM which helps her to sleep. Pertinent positives include excessive daytime somnolence and feeling of fullness in the throat in the morning. She states she does not snore but sleeps alone. She also states that she has occasional paresthesias in her left leg. This occurs after sitting for a long period of time when she is sleeping. She denies any physical weakness or sensory deficit. Pertinent negatives include vision change, hearing loss, sensation of spinning or the room spinning, photophobia, or aura.  She states she has been taking Depo-Provera on and off for the past few years. Her headache is unchanged she is on or off Depo-Provera.  [name] has not needed albuterol for many months. She takes her Advair inhaler daily. She has not felt short of breath for many months.  Past Medical History  Diagnosis Date  . Abnormal Pap smear     3-4 years ago  . Asthma    Current Outpatient Prescriptions  Medication Sig Dispense Refill  . albuterol (PROVENTIL HFA;VENTOLIN HFA) 108 (90 BASE) MCG/ACT inhaler Inhale 1-2 puffs by mouth every 4 hours as needed.  1 Inhaler  0  . Fluticasone-Salmeterol (ADVAIR) 250-50 MCG/DOSE AEPB Inhale 1 puff into the lungs 2 (two) times daily.  3 each  0  . Polyethyl Glycol-Polyvinyl Alc (ARTIFICIAL TEARS) 1-1 % SOLN Apply 2 drops to eye daily.   1 Bottle  e  . triamcinolone (KENALOG) 0.1 % cream Apply to affected area in a thin layer 1-2 times a day for total of 14 days  30 g  0  . mometasone (NASONEX) 50 MCG/ACT nasal spray Place 2 sprays into the nose daily.  17 g  2  . naproxen (NAPROSYN) 250 MG tablet Take 1 tablet (250 mg total) by mouth 2 (two) times daily with a meal.  200 tablet  6  . promethazine (PHENERGAN) 25 MG tablet Take 1 tab by mouth for nausea or dizziness.  30 tablet  0   Current Facility-Administered Medications  Medication Dose Route Frequency Provider Last Rate Last Dose  . medroxyPROGESTERone (DEPO-PROVERA) injection 150 mg  150 mg Intramuscular Q90 days Catalina Antigua, MD   150 mg at 08/02/11 1027   Family History  Problem Relation Age of Onset  . Diabetes Maternal Grandmother   . Hypertension Mother    History   Social History  . Marital Status: Single    Spouse Name: N/A    Number of Children: N/A  . Years of Education: N/A   Social History Main Topics  . Smoking status: Former Smoker -- 0.1 packs/day    Types: Cigars  . Smokeless tobacco: Never Used  . Alcohol Use: 1.5 oz/week    3 drink(s) per week  . Drug Use: No  . Sexually Active: Not Currently    Birth Control/ Protection: Injection   Other  Topics Concern  . Not on file   Social History Narrative  . No narrative on file   Review of Systems: Constitutional: Denies fever, chills, diaphoresis, appetite change and fatigue.  HEENT: Denies photophobia, eye pain, redness, hearing loss, ear pain, congestion, sore throat, rhinorrhea, sneezing, mouth sores, trouble swallowing, neck pain. Endorses tinnitus occasional.   Respiratory: Denies SOB,  Cardiovascular: Denies chest pain,  Gastrointestinal: Denies nausea, vomiting, abdominal pain, diarrhea, constipation,  Genitourinary: Denies difficulty urinating.  Musculoskeletal: Denies myalgias, back pain, joint swelling, arthralgias and gait problem.  Skin: Endorses eczema Neurological: Denies  dizziness, seizures, syncope, weakness, light-headedness, numbness . Endorses occasional tingling in her left leg. Hematological: Denies adenopathy. Easy bruising, personal or family bleeding history   Objective:  Physical Exam: Filed Vitals:   10/05/11 1547  BP: 111/75  Pulse: 87  Temp: 98.2 F (36.8 C)  TempSrc: Oral  Height: 5\' 2"  (1.575 m)  Weight: 126 lb 3.2 oz (57.244 kg)   Constitutional: Vital signs reviewed.  Patient is a well-developed and well-nourished female in no acute distress and cooperative with exam. Alert and oriented x3.  Head: Normocephalic and atraumatic Mouth: no erythema or exudates, MMM. She does have a significant amount of soft tissue in her posterior oropharynx. Eyes: PERRL, EOMI, conjunctivae normal, No scleral icterus.  Cardiovascular: RRR, S1 normal, S2 normal, no MRG, pulses symmetric and intact bilaterally Pulmonary/Chest: CTAB Musculoskeletal: No joint deformities, erythema, or stiffness, ROM full and no nontender Neurological: A&O x3, Strenght is normal and symmetric bilaterally, cranial nerve II-XII are grossly intact, no focal motor deficit, sensory intact to light touch bilaterally.  Skin: Warm, dry and intact. No cyanosis, or clubbing.  lichenified 3 x 3 cm plaque on the dorsal surface of the left hand. Psychiatric: Normal mood and affect. speech and behavior is normal. Judgment and thought content normal. Cognition and memory are normal.   Assessment & Plan:

## 2011-10-05 NOTE — Assessment & Plan Note (Signed)
The cause of Sandra Oneill's headache is unclear at this time. MRI and MRA were negative for acute intracranial pathology which is comforting. Suspicion for aneurysm, mass, MS is very low with these findings. The excessive daytime somnolence and headache that is worse in the morning is fairly characteristic of obstructive sleep apnea. In addition her posterior oropharynx is fairly narrowed. --I will refer her for sleep study to evaluate potential OSA --Avalide prescription for naproxen for daytime relief of headache

## 2011-10-05 NOTE — Assessment & Plan Note (Signed)
Asthma is well controlled. We will not change management at this time.

## 2011-10-05 NOTE — Assessment & Plan Note (Signed)
Advised daily use of her topical steroid. Also advised frequent moisturizer use and to avoid dry skin during the winter.

## 2011-10-06 NOTE — Progress Notes (Signed)
Agree with plans and notes. 

## 2011-10-06 NOTE — Progress Notes (Signed)
[facility] aware [facility] with appt. Stanton Kidney Faydra Korman RN 10/06/11 9AM

## 2011-11-08 ENCOUNTER — Other Ambulatory Visit (HOSPITAL_COMMUNITY)
Admission: RE | Admit: 2011-11-08 | Discharge: 2011-11-08 | Disposition: A | Discharge status: Home / Self Care | Payer: Medicaid Other | Source: Ambulatory Visit | Attending: Obstetrics & Gynecology | Admitting: Obstetrics & Gynecology

## 2011-11-08 ENCOUNTER — Encounter: Payer: Self-pay | Admitting: Obstetrics & Gynecology

## 2011-11-08 ENCOUNTER — Ambulatory Visit (INDEPENDENT_AMBULATORY_CARE_PROVIDER_SITE_OTHER): Payer: Medicaid Other | Admitting: Physician Assistant

## 2011-11-08 VITALS — BP 124/77 | HR 100 | Temp 99.0°F | Ht 62.5 in | Wt 124.0 lb

## 2011-11-08 DIAGNOSIS — Z23 Encounter for immunization: Secondary | ICD-10-CM

## 2011-11-08 DIAGNOSIS — IMO0001 Reserved for inherently not codable concepts without codable children: Secondary | ICD-10-CM

## 2011-11-08 DIAGNOSIS — Z01419 Encounter for gynecological examination (general) (routine) without abnormal findings: Secondary | ICD-10-CM

## 2011-11-08 DIAGNOSIS — Z0000 Encounter for general adult medical examination without abnormal findings: Secondary | ICD-10-CM

## 2011-11-08 DIAGNOSIS — Z3049 Encounter for surveillance of other contraceptives: Secondary | ICD-10-CM

## 2011-11-08 MED ORDER — TETANUS-DIPHTH-ACELL PERTUSSIS 5-2.5-18.5 LF-MCG/0.5 IM SUSP
0.5000 mL | Freq: Once | INTRAMUSCULAR | Status: AC
  Administered 2011-11-08: 0.5 mL via INTRAMUSCULAR

## 2011-11-08 MED ORDER — MEDROXYPROGESTERONE ACETATE 150 MG/ML IM SUSP
150.0000 mg | INTRAMUSCULAR | Status: AC
  Administered 2011-11-08 – 2012-04-10 (×2): 150 mg via INTRAMUSCULAR

## 2011-11-08 NOTE — Patient Instructions (Addendum)
Pap Test A Pap test is a sampling of cells from a woman's cervix. The cervix is the opening between the vagina (birth canal) and the uterus (the bottom part of the womb). The cells are scraped from the cervix during a pelvic exam. These cells are then looked at under a microscope to see if the cells are normal or to see if a cancer is developing or there are changes that suggest a cancer will develop. Cervical dysplasia is a condition in which a woman has abnormal changes in the top layer of cells of her cervix. These changes are an early sign that cervical cancer may develop. Pap tests also look for the human papilloma virus (HPV) because it has 4 types that are responsible for 70% of cervical cancer. Infections can also be found during a Pap test such as bacteria, fungus, protozoa and viruses.  Cervical cancer is harder to treat and less likely to have a good outcome if left untreated. Catching the disease at an early stage leads to a better outcome. Since the Pap test was introduced 60 years ago, deaths from cervical cancer have decreased by 70%. Every woman should keep up to date with Pap tests. RISK FACTORS FOR CERVICAL CANCER INCLUDE:   Becoming sexually active before age 69.   Being the daughter of a woman who took diethylstilbestrol (DES) during pregnancy.   Having a sexual partner who has or has had cancer of the penis.   Having a sexual partner whose past partner had cervical cancer or cervical dysplasia (early cell changes which suggest a cancer may develop).   Having a weakened immune system. An example would be HIV or other immunodeficiency disorder.   Having had a sexually transmitted infection such as chlamydia, gonorrhea or HPV.   Having had an abnormal Pap or cancer of the vagina or vulva.   Having had more than one sexual partner.   A history of cervical cancer in a woman's sister or mother.   Not using condoms with new sexual partners.   Smoking.  WHO SHOULD HAVE PAP  TESTS  A Pap test is done to screen for cervical cancer.   The first Pap test should be done at age 50.   Between ages 33 and 77, Pap tests are repeated every 2 years.   Beginning at age 75, you are advised to have a Pap test every 3 years as long as your past 3 Pap tests have been normal.   Some women have medical problems that increase the chance of getting cervical cancer. Talk to your caregiver about these problems. It is especially important to talk to your caregiver if a new problem develops soon after your last Pap test. In these cases, your caregiver may recommend more frequent screening and Pap tests.   The above recommendations are the same for women who have or have not gotten the vaccine for HPV (Human Papillomavirus).   If you had a hysterectomy for a problem that was not a cancer or a condition that could lead to cancer, then you no longer need Pap tests. However, even if you no longer need a Pap test, a regular exam is a good idea to make sure no other problems are starting.    If you are between ages 71 and 24, and you have had normal Pap tests going back 10 years, you no longer need Pap tests. However, even if you no longer need a Pap test, a regular exam is a good idea  to make sure no other problems are starting.    If you have had past treatment for cervical cancer or a condition that could lead to cancer, you need Pap tests and screening for cancer for at least 20 years after your treatment.   If Pap tests have been discontinued, risk factors (such as a new sexual partner) need to be re-assessed to determine if screening should be resumed.   Some women may need screenings more often if they are at high risk for cervical cancer.  PREPARATION FOR A PAP TEST A Pap test should be performed during the weeks before the start of menstruation. Women should not douche or have sexual intercourse for 24 hours before the test. No vaginal creams, diaphragms, or tampons should be  used for 24 hours before the test. To minimize discomfort, a woman should empty her bladder just before the exam. TAKING THE PAP TEST The caregiver will perform a pelvic exam. A metal or plastic instrument (speculum) is placed in the vagina. This is done before your caregiver does a bimanual exam of your internal female organs. This instrument allows your caregiver to see the inside of the vagina and look at the cervix. A small, sterile brush is used to take a sample of cells from the internal opening of the cervix. A small wooden spatula is used to scrape the outside of the cervix. Neither of these two methods to collect cells will cause you pain. These two scrapings are placed on a glass slide or in a small bottle filled with a special liquid. The cells are looked at later under a microscope in a lab. A specialist will look at these cells and determine if the cells are normal. RESULTS OF YOUR PAP TEST  A healthy Pap test shows no abnormal cells or evidence of inflammation.   The presence of abnormally growing cells on the surface of the cervix may be reported as an abnormal Pap test. Different categories of findings are used to describe your Pap test. Your caregiver will go over the importance of these findings with you. The caregiver will then determine what follow-up is needed or when you should have your next pap test.   If you have had two or more abnormal Pap tests:   You may be asked to have a colposcopy. This is a test in which the cervix is viewed with a special lighted microscope.   A cervical tissue sample (biopsy) may also be needed. This involves taking a small tissue sample from the cervix. The sample is looked at under a microscope to find the cause of the abnormal cells. Make sure you find out the results of the Pap test. If you have not received the results within two weeks, contact your caregiver's office for the results. Do not assume everything is normal if you have not heard from  your caregiver or medical facility. It is important to follow up on all of your test results.  Document Released: 12/23/2002 Document Revised: 06/14/2011 Document Reviewed: 02/24/2008 [facility] Patient Information 2012 [place], [place]. Preventive Care for Adults, Female A healthy lifestyle and preventive care can promote health and wellness. Preventive health guidelines for women include the following key practices.  A routine yearly physical is a good way to check with your caregiver about your health and preventive screening. It is a chance to share any concerns and updates on your health, and to receive a thorough exam.  Visit your dentist for a routine exam and preventive care  every 6 months. Brush your teeth twice a day and floss once a day. Good oral hygiene prevents tooth decay and gum disease.  The frequency of eye exams is based on your age, health, family medical history, use of contact lenses, and other factors. Follow your caregiver's recommendations for frequency of eye exams.  Eat a healthy diet. Foods like vegetables, fruits, whole grains, low-fat dairy products, and lean protein foods contain the nutrients you need without too many calories. Decrease your intake of foods high in solid fats, added sugars, and salt. Eat the right amount of calories for you.Get information about a proper diet from your caregiver, if necessary.  Regular physical exercise is one of the most important things you can do for your health. Most adults should get at least 150 minutes of moderate-intensity exercise (any activity that increases your heart rate and causes you to sweat) each week. In addition, most adults need muscle-strengthening exercises on 2 or more days a week.  Maintain a healthy weight. The body mass index (BMI) is a screening tool to identify possible weight problems. It provides an estimate of body fat based on height and weight. Your caregiver can help determine your BMI, and can help you  achieve or maintain a healthy weight.For adults 20 years and older:  A BMI below 18.5 is considered underweight.  A BMI of 18.5 to 24.9 is normal.  A BMI of 25 to 29.9 is considered overweight.  A BMI of 30 and above is considered obese.  Maintain normal blood lipids and cholesterol levels by exercising and minimizing your intake of saturated fat. Eat a balanced diet with plenty of fruit and vegetables. Blood tests for lipids and cholesterol should begin at age 35 and be repeated every 5 years. If your lipid or cholesterol levels are high, you are over 50, or you are at high risk for heart disease, you may need your cholesterol levels checked more frequently.Ongoing high lipid and cholesterol levels should be treated with medicines if diet and exercise are not effective.  If you smoke, find out from your caregiver how to quit. If you do not use tobacco, do not start.  If you are pregnant, do not drink alcohol. If you are breastfeeding, be very cautious about drinking alcohol. If you are not pregnant and choose to drink alcohol, do not exceed 1 drink per day. One drink is considered to be 12 ounces (355 mL) of beer, 5 ounces (148 mL) of wine, or 1.5 ounces (44 mL) of liquor.  Avoid use of street drugs. Do not share needles with anyone. Ask for help if you need support or instructions about stopping the use of drugs.  High blood pressure causes heart disease and increases the risk of stroke. Your blood pressure should be checked at least every 1 to 2 years. Ongoing high blood pressure should be treated with medicines if weight loss and exercise are not effective.  If you are 1 to 27 years old, ask your caregiver if you should take aspirin to prevent strokes.  Diabetes screening involves taking a blood sample to check your fasting blood sugar level. This should be done once every 3 years, after age 44, if you are within normal weight and without risk factors for diabetes. Testing should be  considered at a younger age or be carried out more frequently if you are overweight and have at least 1 risk factor for diabetes.  Breast cancer screening is essential preventive care for women. You should  practice "breast self-awareness." This means understanding the normal appearance and feel of your breasts and may include breast self-examination. Any changes detected, no matter how small, should be reported to a caregiver. Women in their 39s and 30s should have a clinical breast exam (CBE) by a caregiver as part of a regular health exam every 1 to 3 years. After age 22, women should have a CBE every year. Starting at age 20, women should consider having a mammography (breast X-ray test) every year. Women who have a family history of breast cancer should talk to their caregiver about genetic screening. Women at a high risk of breast cancer should talk to their caregivers about having magnetic resonance imaging (MRI) and a mammography every year.  The Pap test is a screening test for cervical cancer. A Pap test can show cell changes on the cervix that might become cervical cancer if left untreated. A Pap test is a procedure in which cells are obtained and examined from the lower end of the uterus (cervix).  Women should have a Pap test starting at age 88.  Between ages 69 and 63, Pap tests should be repeated every 2 years.  Beginning at age 34, you should have a Pap test every 3 years as long as the past 3 Pap tests have been normal.  Some women have medical problems that increase the chance of getting cervical cancer. Talk to your caregiver about these problems. It is especially important to talk to your caregiver if a new problem develops soon after your last Pap test. In these cases, your caregiver may recommend more frequent screening and Pap tests.  The above recommendations are the same for women who have or have not gotten the vaccine for human papillomavirus (HPV).  If you had a  hysterectomy for a problem that was not cancer or a condition that could lead to cancer, then you no longer need Pap tests. Even if you no longer need a Pap test, a regular exam is a good idea to make sure no other problems are starting.  If you are between ages 76 and 10, and you have had normal Pap tests going back 10 years, you no longer need Pap tests. Even if you no longer need a Pap test, a regular exam is a good idea to make sure no other problems are starting.  If you have had past treatment for cervical cancer or a condition that could lead to cancer, you need Pap tests and screening for cancer for at least 20 years after your treatment.  If Pap tests have been discontinued, risk factors (such as a new sexual partner) need to be reassessed to determine if screening should be resumed.  The HPV test is an additional test that may be used for cervical cancer screening. The HPV test looks for the virus that can cause the cell changes on the cervix. The cells collected during the Pap test can be tested for HPV. The HPV test could be used to screen women aged 84 years and older, and should be used in women of any age who have unclear Pap test results. After the age of 60, women should have HPV testing at the same frequency as a Pap test.  Colorectal cancer can be detected and often prevented. Most routine colorectal cancer screening begins at the age of 29 and continues through age 29. However, your caregiver may recommend screening at an earlier age if you have risk factors for colon cancer. On a  yearly basis, your caregiver may provide home test kits to check for hidden blood in the stool. Use of a small camera at the end of a tube, to directly examine the colon (sigmoidoscopy or colonoscopy), can detect the earliest forms of colorectal cancer. Talk to your caregiver about this at age 67, when routine screening begins. Direct examination of the colon should be repeated every 5 to 10 years through age  16, unless early forms of pre-cancerous polyps or small growths are found.  Hepatitis C blood testing is recommended for all people born from [birth year] through 1965 and any individual with known risks for hepatitis C.  Practice safe sex. Use condoms and avoid high-risk sexual practices to reduce the spread of sexually transmitted infections (STIs). STIs include gonorrhea, chlamydia, syphilis, trichomonas, herpes, HPV, and human immunodeficiency virus (HIV). Herpes, HIV, and HPV are viral illnesses that have no cure. They can result in disability, cancer, and death. Sexually active women aged 4 and younger should be checked for chlamydia. Older women with new or multiple partners should also be tested for chlamydia. Testing for other STIs is recommended if you are sexually active and at increased risk.  Osteoporosis is a disease in which the bones lose minerals and strength with aging. This can result in serious bone fractures. The risk of osteoporosis can be identified using a bone density scan. Women ages 52 and over and women at risk for fractures or osteoporosis should discuss screening with their caregivers. Ask your caregiver whether you should take a calcium supplement or vitamin D to reduce the rate of osteoporosis.  Menopause can be associated with physical symptoms and risks. Hormone replacement therapy is available to decrease symptoms and risks. You should talk to your caregiver about whether hormone replacement therapy is right for you.  Use sunscreen with sun protection factor (SPF) of 30 or more. Apply sunscreen liberally and repeatedly throughout the day. You should seek shade when your shadow is shorter than you. Protect yourself by wearing long sleeves, pants, a wide-brimmed hat, and sunglasses year round, whenever you are outdoors.  Once a month, do a whole body skin exam, using a mirror to look at the skin on your back. Notify your caregiver of new moles, moles that have irregular borders,  moles that are larger than a pencil eraser, or moles that have changed in shape or color.  Stay current with required immunizations.  Influenza. You need a dose every fall (or winter). The composition of the flu vaccine changes each year, so being vaccinated once is not enough.  Pneumococcal polysaccharide. You need 1 to 2 doses if you smoke cigarettes or if you have certain chronic medical conditions. You need 1 dose at age 4 (or older) if you have never been vaccinated.  Tetanus, diphtheria, pertussis (Tdap, Td). Get 1 dose of Tdap vaccine if you are younger than age 13, are over 8 and have contact with an infant, are a Research scientist (physical sciences), are pregnant, or simply want to be protected from whooping cough. After that, you need a Td booster dose every 10 years. Consult your caregiver if you have not had at least 3 tetanus and diphtheria-containing shots sometime in your life or have a deep or dirty wound.  HPV. You need this vaccine if you are a woman age 95 or younger. The vaccine is given in 3 doses over 6 months.  Measles, mumps, rubella (MMR). You need at least 1 dose of MMR if you were born in 1957 or  later. You may also need a second dose.  Meningococcal. If you are age 25 to 55 and a first-year college student living in a residence hall, or have one of several medical conditions, you need to get vaccinated against meningococcal disease. You may also need additional booster doses.  Zoster (shingles). If you are age 51 or older, you should get this vaccine.  Varicella (chickenpox). If you have never had chickenpox or you were vaccinated but received only 1 dose, talk to your caregiver to find out if you need this vaccine.  Hepatitis A. You need this vaccine if you have a specific risk factor for hepatitis A virus infection or you simply wish to be protected from this disease. The vaccine is usually given as 2 doses, 6 to 18 months apart.  Hepatitis B. You need this vaccine if you have a  specific risk factor for hepatitis B virus infection or you simply wish to be protected from this disease. The vaccine is given in 3 doses, usually over 6 months. Preventive Services / Frequency Ages 66 to 60  Blood pressure check.** / Every 1 to 2 years.  Lipid and cholesterol check.** / Every 5 years beginning at age 74.  Clinical breast exam.** / Every 3 years for women in their 67s and 30s.  Pap test.** / Every 2 years from ages 67 through 45. Every 3 years starting at age 65 through age 44 or 59 with a history of 3 consecutive normal Pap tests.  HPV screening.** / Every 3 years from ages 11 through ages 42 to 64 with a history of 3 consecutive normal Pap tests.  Hepatitis C blood test.** / For any individual with known risks for hepatitis C.  Skin self-exam. / Monthly.  Influenza immunization.** / Every year.  Pneumococcal polysaccharide immunization.** / 1 to 2 doses if you smoke cigarettes or if you have certain chronic medical conditions.  Tetanus, diphtheria, pertussis (Tdap, Td) immunization. / A one-time dose of Tdap vaccine. After that, you need a Td booster dose every 10 years.  HPV immunization. / 3 doses over 6 months, if you are 50 and younger.  Measles, mumps, rubella (MMR) immunization. / You need at least 1 dose of MMR if you were born in 1957 or later. You may also need a second dose.  Meningococcal immunization. / 1 dose if you are age 64 to 66 and a first-year college student living in a residence hall, or have one of several medical conditions, you need to get vaccinated against meningococcal disease. You may also need additional booster doses.  Varicella immunization.** / Consult your caregiver.  Hepatitis A immunization.** / Consult your caregiver. 2 doses, 6 to 18 months apart.  Hepatitis B immunization.** / Consult your caregiver. 3 doses usually over 6 months. Ages 5 to 88  Blood pressure check.** / Every 1 to 2 years.  Lipid and cholesterol  check.** / Every 5 years beginning at age 52.  Clinical breast exam.** / Every year after age 72.  Mammogram.** / Every year beginning at age 70 and continuing for as long as you are in good health. Consult with your caregiver.  Pap test.** / Every 3 years starting at age 34 through age 35 or 18 with a history of 3 consecutive normal Pap tests.  HPV screening.** / Every 3 years from ages 23 through ages 71 to 30 with a history of 3 consecutive normal Pap tests.  Fecal occult blood test (FOBT) of stool. / Every year  beginning at age 60 and continuing until age 39. You may not need to do this test if you get a colonoscopy every 10 years.  Flexible sigmoidoscopy or colonoscopy.** / Every 5 years for a flexible sigmoidoscopy or every 10 years for a colonoscopy beginning at age 73 and continuing until age 32.  Hepatitis C blood test.** / For all people born from [birth year] through 1966 and any individual with known risks for hepatitis C.  Skin self-exam. / Monthly.  Influenza immunization.** / Every year.  Pneumococcal polysaccharide immunization.** / 1 to 2 doses if you smoke cigarettes or if you have certain chronic medical conditions.  Tetanus, diphtheria, pertussis (Tdap, Td) immunization.** / A one-time dose of Tdap vaccine. After that, you need a Td booster dose every 10 years.  Measles, mumps, rubella (MMR) immunization. / You need at least 1 dose of MMR if you were born in 1957 or later. You may also need a second dose.  Varicella immunization.** / Consult your caregiver.  Meningococcal immunization.** / Consult your caregiver.  Hepatitis A immunization.** / Consult your caregiver. 2 doses, 6 to 18 months apart.  Hepatitis B immunization.** / Consult your caregiver. 3 doses, usually over 6 months. Ages 59 and over  Blood pressure check.** / Every 1 to 2 years.  Lipid and cholesterol check.** / Every 5 years beginning at age 40.  Clinical breast exam.** / Every year after age  58.  Mammogram.** / Every year beginning at age 73 and continuing for as long as you are in good health. Consult with your caregiver.  Pap test.** / Every 3 years starting at age 72 through age 39 or 1 with a 3 consecutive normal Pap tests. Testing can be stopped between 65 and 70 with 3 consecutive normal Pap tests and no abnormal Pap or HPV tests in the past 10 years.  HPV screening.** / Every 3 years from ages 29 through ages 17 or 50 with a history of 3 consecutive normal Pap tests. Testing can be stopped between 65 and 70 with 3 consecutive normal Pap tests and no abnormal Pap or HPV tests in the past 10 years.  Fecal occult blood test (FOBT) of stool. / Every year beginning at age 27 and continuing until age 25. You may not need to do this test if you get a colonoscopy every 10 years.  Flexible sigmoidoscopy or colonoscopy.** / Every 5 years for a flexible sigmoidoscopy or every 10 years for a colonoscopy beginning at age 18 and continuing until age 98.  Hepatitis C blood test.** / For all people born from [birth year] through 1966 and any individual with known risks for hepatitis C.  Osteoporosis screening.** / A one-time screening for women ages 40 and over and women at risk for fractures or osteoporosis.  Skin self-exam. / Monthly.  Influenza immunization.** / Every year.  Pneumococcal polysaccharide immunization.** / 1 dose at age 10 (or older) if you have never been vaccinated.  Tetanus, diphtheria, pertussis (Tdap, Td) immunization. / A one-time dose of Tdap vaccine if you are over 65 and have contact with an infant, are a Research scientist (physical sciences), or simply want to be protected from whooping cough. After that, you need a Td booster dose every 10 years.  Varicella immunization.** / Consult your caregiver.  Meningococcal immunization.** / Consult your caregiver.  Hepatitis A immunization.** / Consult your caregiver. 2 doses, 6 to 18 months apart.  Hepatitis B immunization.** / Check with  your caregiver. 3 doses, usually over 6 months. **  Family history and personal history of risk and conditions may change your caregiver's recommendations. Document Released: 11/28/2001 Document Revised: 12/25/2011 Document Reviewed: 02/27/2011 [facility] Patient Information 2013 [place], [place].

## 2011-11-09 ENCOUNTER — Telehealth: Payer: Self-pay | Admitting: *Deleted

## 2011-11-09 NOTE — Telephone Encounter (Signed)
She is young and no longer smokes. She almost 100% doesn't have CAD. Tdap more likely to cause local rxns. I would not do anything unless CP returns.

## 2011-11-09 NOTE — Telephone Encounter (Signed)
Pt informed and voices understanding 

## 2011-11-09 NOTE — Telephone Encounter (Signed)
Pt called stating she was seen OB/GYN yesterday and received Tdap shot.  Today she experienced some chest pain and wanted to know if it's from the pertussis in the injection.  It has resolved now and pt is at work. Please advise Pt # [phone]

## 2011-12-12 ENCOUNTER — Other Ambulatory Visit: Payer: Self-pay | Admitting: *Deleted

## 2011-12-12 MED ORDER — MOMETASONE FUROATE 50 MCG/ACT NA SUSP: 2.0000 | Freq: Every day | NASAL | Status: DC

## 2011-12-12 NOTE — Telephone Encounter (Signed)
Nasonex refill - rx request from faxed to [facility].

## 2011-12-22 ENCOUNTER — Other Ambulatory Visit: Payer: Self-pay | Admitting: *Deleted

## 2011-12-22 MED ORDER — TRIAMCINOLONE ACETONIDE 0.1 % EX CREA: TOPICAL_CREAM | CUTANEOUS | Status: DC

## 2011-12-22 MED ORDER — FLUTICASONE-SALMETEROL 250-50 MCG/DOSE IN AEPB: 1.0000 | INHALATION_SPRAY | Freq: Two times a day (BID) | RESPIRATORY_TRACT | Status: DC

## 2011-12-22 MED ORDER — ALBUTEROL SULFATE HFA 108 (90 BASE) MCG/ACT IN AERS: INHALATION_SPRAY | RESPIRATORY_TRACT | Status: DC

## 2012-01-24 ENCOUNTER — Ambulatory Visit (INDEPENDENT_AMBULATORY_CARE_PROVIDER_SITE_OTHER): Payer: Medicaid Other | Admitting: Medical

## 2012-01-24 VITALS — BP 111/74 | HR 80

## 2012-01-24 DIAGNOSIS — Z3049 Encounter for surveillance of other contraceptives: Secondary | ICD-10-CM

## 2012-01-24 MED ORDER — MEDROXYPROGESTERONE ACETATE 150 MG/ML IM SUSP
150.0000 mg | Freq: Once | INTRAMUSCULAR | Status: AC
  Administered 2012-01-24: 150 mg via INTRAMUSCULAR

## 2012-02-05 ENCOUNTER — Encounter: Payer: Self-pay | Admitting: Internal Medicine

## 2012-02-05 ENCOUNTER — Ambulatory Visit (INDEPENDENT_AMBULATORY_CARE_PROVIDER_SITE_OTHER): Payer: Medicaid Other | Admitting: Internal Medicine

## 2012-02-05 VITALS — BP 114/80 | HR 81 | Temp 98.8°F | Resp 20 | Ht 63.5 in | Wt 131.4 lb

## 2012-02-05 DIAGNOSIS — J069 Acute upper respiratory infection, unspecified: Secondary | ICD-10-CM

## 2012-02-05 MED ORDER — GUAIFENESIN-DM 100-10 MG/5ML PO SYRP: 5.0000 mL | ORAL_SOLUTION | Freq: Three times a day (TID) | ORAL | Status: AC | PRN

## 2012-02-05 NOTE — Progress Notes (Signed)
Patient ID: Sandra Oneill, female   DOB: 02/14/1986, 26 y.o.   MRN: [id number]  Subjective:   Patient ID: Sandra Oneill female   DOB: 15-Jun-1986 26 y.o.   MRN: [id number]  HPI: [name] is a 26 y.o. woman with PMH of asthma who presents to the clinic with sore throat.  Patient states that she started to have cold like symptoms on 4 days ago. She reports runny nose, ears fullness, sore throat, productive cough with mild thick yelloowish sputum and chest congestion/aching from coughing. Patient also states that she has post nasal drip at nighttime which makes her cough worse.   Patient states that she took OTC Claritin without any relief. She did not take any other OPT medication. 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 She is not sure whether they are on ABX or not.   Denies fever, chills or drainage from her ears. No shortness of breath or dyspnea on exertion. No chest pain, chest pressure or palpitation.  No nausea, vomiting, or abdominal pain. No melena, diarrhea or incontinence. No muscle weakness. Denies depression. No appetite or weight changes.      Past Medical History  Diagnosis Date  . Abnormal Pap smear     3-4 years ago  . Asthma    Current Outpatient Prescriptions  Medication Sig Dispense Refill  . albuterol (PROVENTIL HFA;VENTOLIN HFA) 108 (90 BASE) MCG/ACT inhaler Inhale 1-2 puffs by mouth every 4 hours as needed.  3 Inhaler  3  . Fluticasone-Salmeterol (ADVAIR) 250-50 MCG/DOSE AEPB Inhale 1 puff into the lungs 2 (two) times daily.  9 each  3  . mometasone (NASONEX) 50 MCG/ACT nasal spray Place 2 sprays into the nose daily.  17 g  2  . Polyethyl Glycol-Polyvinyl Alc (ARTIFICIAL TEARS) 1-1 % SOLN Apply 2 drops to eye daily.  1 Bottle  e  . triamcinolone cream (KENALOG) 0.1 % Apply to affected area in a thin layer 1-2 times a day for total of 14 days  30 g  0  . ibuprofen (ADVIL,MOTRIN) 200 MG tablet Take  200 mg by mouth every 6 (six) hours as needed.       Current Facility-Administered Medications  Medication Dose Route Frequency Provider Last Rate Last Dose  . medroxyPROGESTERone (DEPO-PROVERA) injection 150 mg  150 mg Intramuscular Q90 days August Luz, CNM   150 mg at 11/08/11 1650   Family History  Problem Relation Age of Onset  . Diabetes Maternal Grandmother   . Hypertension Mother    History   Social History  . Marital Status: Single    Spouse Name: N/A    Number of Children: N/A  . Years of Education: N/A   Social History Main Topics  . Smoking status: Former Smoker -- 0.1 packs/day    Types: Cigars    Quit date: 08/07/2011  . Smokeless tobacco: Never Used  . Alcohol Use: 1.5 oz/week    3 drink(s) per week  . Drug Use: No  . Sexually Active: Not Currently    Birth Control/ Protection: Injection   Other Topics Concern  . None   Social History Narrative  . None   Review of Systems: See HPI  Objective:  Physical Exam: Filed Vitals:   02/05/12 1002  BP: 114/80  Pulse: 81  Temp: 98.8 F (37.1 C)  TempSrc: Oral  Resp: 20  Height: 5' 3.5" (1.613 m)  Weight: 131  lb 6.4 oz (59.603 kg)  SpO2: 100%   General: alert, well-developed, and cooperative to examination.  Head: normocephalic and atraumatic.  Eyes: vision grossly intact, pupils equal, pupils round, pupils reactive to light, no injection and anicteric.  Mouth: pharynx pink and moist, no erythema, and no exudates.  Neck: supple, full ROM, no thyromegaly, no JVD, and no carotid bruits.  Lungs: normal respiratory effort, no accessory muscle use, normal breath sounds, no crackles, and no wheezes. Heart: normal rate, regular rhythm, no murmur, no gallop, and no rub.  Abdomen: soft, non-tender, normal bowel sounds, no distention, no guarding, no rebound tenderness, no hepatomegaly, and no splenomegaly.  Msk: no joint swelling, no joint warmth, and no redness over joints.  Pulses: 2+ DP/PT pulses  bilaterally Extremities: No cyanosis, clubbing, edema Neurologic: alert & oriented X3, cranial nerves II-XII intact, strength normal in all extremities, sensation intact to light touch, and gait normal.  Skin: turgor normal and no rashes.  Psych: Oriented X3, memory intact for recent and remote, normally interactive, good eye contact, not anxious appearing, and not depressed appearing.   Assessment & Plan:

## 2012-02-05 NOTE — Assessment & Plan Note (Signed)
The clinical manifestation is consistent with the URI, likely viral. - will check group A strep screen.  - will treatment symptomatically. - Robitussin PRN, Tylenol PRN - encourage oral fluid intake, - increase Vitamin C intake - use cool mist humidifier at night.  - if spiked fever or symptoms worsening, come back to [facility] or ED

## 2012-02-05 NOTE — Patient Instructions (Addendum)
1.- encourage oral fluid intake, - increase Vitamin C intake - use cool mist humidifier at night.   If you spiked a fever or your symptoms are worsening, come back to clinic or go to ED 2. Follow up with PCP  in 2 months.

## 2012-02-12 ENCOUNTER — Other Ambulatory Visit: Payer: Self-pay | Admitting: *Deleted

## 2012-02-14 MED ORDER — TRIAMCINOLONE ACETONIDE 0.1 % EX CREA: TOPICAL_CREAM | CUTANEOUS | Status: DC

## 2012-02-15 NOTE — Telephone Encounter (Signed)
Kenalog rx refilled - request form faxed to [facility].

## 2012-03-14 ENCOUNTER — Other Ambulatory Visit: Payer: Self-pay | Admitting: *Deleted

## 2012-03-15 MED ORDER — MOMETASONE FUROATE 50 MCG/ACT NA SUSP: 2.0000 | Freq: Every day | NASAL | Status: DC

## 2012-03-15 NOTE — Telephone Encounter (Signed)
Faxed in

## 2012-04-04 ENCOUNTER — Telehealth: Payer: Self-pay | Admitting: *Deleted

## 2012-04-04 ENCOUNTER — Ambulatory Visit (INDEPENDENT_AMBULATORY_CARE_PROVIDER_SITE_OTHER): Payer: Medicaid Other | Admitting: Internal Medicine

## 2012-04-04 ENCOUNTER — Encounter: Payer: Self-pay | Admitting: Internal Medicine

## 2012-04-04 VITALS — BP 121/81 | HR 75 | Temp 100.0°F | Ht 64.2 in | Wt 137.9 lb

## 2012-04-04 DIAGNOSIS — B354 Tinea corporis: Secondary | ICD-10-CM | POA: Insufficient documentation

## 2012-04-04 MED ORDER — ALBUTEROL SULFATE HFA 108 (90 BASE) MCG/ACT IN AERS: INHALATION_SPRAY | RESPIRATORY_TRACT | Status: DC

## 2012-04-04 MED ORDER — TRIAMCINOLONE ACETONIDE 0.1 % EX CREA: TOPICAL_CREAM | CUTANEOUS | Status: DC

## 2012-04-04 NOTE — Progress Notes (Signed)
Subjective:     Patient ID: Sandra Oneill, female   DOB: 1986/01/02, 26 y.o.   MRN: [id number]  HPI Patient is a very pleasant 26 year old woman who presents with one week of a slowly expanding erythematous skin lesion on her anterior chest. She describes it as per right, slowly enlarging, has tried triamcinolone cream with no relief. She works at a [employer].  She also notes tiny punctate skin lesions scattered around the rest of her body.  Review of Systems     Objective:   Physical Exam Gen: NAD Skin: penny sized, raised patch on anteior chest.  Ring of erythema with central clearing.  Tiny papule/vesicle on R dorsal hand, L forearm.    Assessment:         Plan:

## 2012-04-04 NOTE — Telephone Encounter (Signed)
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 Needs note pt can return to work.  Appt 04/04/12 1:45PM Dr Abner Greenspan. Stanton Kidney Lashann Hagg RN 04/04/12 11AM

## 2012-04-04 NOTE — Patient Instructions (Signed)
1. Terbinafine 1% lotion/cream.  Apply to affected area, extending at least 1 inch beyond red border.  Apply twice daily until resolution.

## 2012-04-04 NOTE — Assessment & Plan Note (Addendum)
Penny sized lesion on anterior chest that is raised, pruritic, and has a red ring is consistent with ringworm. Other skin lesions appear to be benign, possibly sebaceous, acne versus cysts. They do not appear concerning. - Terbinafine 1% twice a day until resolution - Given note that it is okay to return to work

## 2012-04-10 ENCOUNTER — Ambulatory Visit (INDEPENDENT_AMBULATORY_CARE_PROVIDER_SITE_OTHER): Payer: Medicaid Other | Admitting: Obstetrics and Gynecology

## 2012-04-10 VITALS — BP 133/80 | HR 74 | Temp 97.1°F | Ht 62.0 in | Wt 136.2 lb

## 2012-04-10 DIAGNOSIS — Z3049 Encounter for surveillance of other contraceptives: Secondary | ICD-10-CM

## 2012-04-30 ENCOUNTER — Telehealth: Payer: Self-pay | Admitting: *Deleted

## 2012-04-30 NOTE — Telephone Encounter (Signed)
Pt left message stating that she has been bleeding since Sat. 7/13. She is taking depo Provera and has questions. Please call back.

## 2012-04-30 NOTE — Telephone Encounter (Signed)
Returned pt call and discussed her bleeding concerns. She reports having light spotting which she only sees when she wipes after voiding.  I advised pt that this can be normal. She may periodically have some bleeding while on long term depo which can be heavy or light. It is not considered a problem and the medication is still working for birth control.  If the bleeding continues, she may want to discuss it with the doctor prior to receiving her next injection. Pt voiced understanding.

## 2012-05-14 ENCOUNTER — Encounter (HOSPITAL_COMMUNITY): Payer: Self-pay | Admitting: *Deleted

## 2012-05-14 ENCOUNTER — Emergency Department (INDEPENDENT_AMBULATORY_CARE_PROVIDER_SITE_OTHER)
Admission: EM | Admit: 2012-05-14 | Discharge: 2012-05-14 | Disposition: A | Discharge status: Home / Self Care | Payer: Medicaid Other | Source: Home / Self Care | Attending: Emergency Medicine | Admitting: Emergency Medicine

## 2012-05-14 DIAGNOSIS — K0889 Other specified disorders of teeth and supporting structures: Secondary | ICD-10-CM

## 2012-05-14 DIAGNOSIS — K089 Disorder of teeth and supporting structures, unspecified: Secondary | ICD-10-CM

## 2012-05-14 MED ORDER — ACETAMINOPHEN-CODEINE #3 300-30 MG PO TABS: 1.0000 | ORAL_TABLET | Freq: Four times a day (QID) | ORAL | Status: AC | PRN

## 2012-05-14 MED ORDER — CLINDAMYCIN HCL 150 MG PO CAPS: 150.0000 mg | ORAL_CAPSULE | Freq: Three times a day (TID) | ORAL | Status: AC

## 2012-05-14 NOTE — ED Provider Notes (Signed)
History     CSN: [id number]  Arrival date & time 05/14/12  1723   First MD Initiated Contact with Patient 05/14/12 1725      Chief Complaint  Patient presents with  . Dental Pain    (Consider location/radiation/quality/duration/timing/severity/associated sxs/prior treatment) HPI Comments: Patient presents urgent care tonight complaining of an ongoing left upper toothache. Some discrete swelling around her left jaw line and she has felt and observe some pus draining from her tooth. She denies any headaches, or fevers. And denies having received any dental care. She's been taking some over-the-counter medicines for the throbbing pain.  Patient is a 26 y.o. female presenting with tooth pain. The history is provided by the patient.  Dental PainThe primary symptoms include sore throat. Primary symptoms do not include dental injury, oral bleeding, headaches or fever. The symptoms began more than 1 week ago. The symptoms are worsening. The symptoms are recurrent. The symptoms occur constantly.  Additional symptoms include: gum swelling. Additional symptoms do not include: excessive salivation, ear pain, hearing loss and nosebleeds.    Past Medical History  Diagnosis Date  . Abnormal Pap smear     3-4 years ago  . Asthma     History reviewed. No pertinent past surgical history.  Family History  Problem Relation Age of Onset  . Diabetes Maternal Grandmother   . Hypertension Mother     History  Substance Use Topics  . Smoking status: Former Smoker -- 0.1 packs/day    Types: Cigars    Quit date: 08/07/2011  . Smokeless tobacco: Never Used  . Alcohol Use: 1.5 oz/week    3 drink(s) per week    OB History    Grav Para Term Preterm Abortions TAB SAB Ect Mult Living   0               Review of Systems  Constitutional: Negative for fever.  HENT: Positive for sore throat. Negative for hearing loss, ear pain and nosebleeds.   Neurological: Negative for headaches.    Allergies    Amoxicillin and Sulfamethoxazole w-trimethoprim  Home Medications   Current Outpatient Rx  Name Route Sig Dispense Refill  . ACETAMINOPHEN-CODEINE #3 300-30 MG PO TABS Oral Take 1-2 tablets by mouth every 6 (six) hours as needed for pain. 15 tablet 0  . ALBUTEROL SULFATE HFA 108 (90 BASE) MCG/ACT IN AERS  Inhale 1-2 puffs by mouth every 4 hours as needed. 3 Inhaler 3  . CLINDAMYCIN HCL 150 MG PO CAPS Oral Take 1 capsule (150 mg total) by mouth 3 (three) times daily. 15 capsule 0  . FLUTICASONE-SALMETEROL 250-50 MCG/DOSE IN AEPB Inhalation Inhale 1 puff into the lungs 2 (two) times daily. 9 each 3  . IBUPROFEN 200 MG PO TABS Oral Take 200 mg by mouth every 6 (six) hours as needed.    . MOMETASONE FUROATE 50 MCG/ACT NA SUSP Nasal Place 2 sprays into the nose daily. 17 g 2  . POLYETHYL GLYCOL-POLYVINYL ALC 1-1 % OP SOLN Ophthalmic Apply 2 drops to eye daily. 1 Bottle e  . TRIAMCINOLONE ACETONIDE 0.1 % EX CREA  Apply to affected area in a thin layer 1-2 times a day for total of 14 days 30 g 2    BP 130/89  Pulse 82  Temp 99.1 F (37.3 C) (Oral)  Resp 16  SpO2 100%  Physical Exam  Nursing note and vitals reviewed. Constitutional: She appears well-developed and well-nourished.  HENT:  Mouth/Throat: Oropharynx is clear and moist. Uvula  swelling and dental caries present. No dental abscesses. No oropharyngeal exudate.    Eyes: Conjunctivae are normal.  Neck: Normal range of motion.  Skin: No rash noted.    ED Course  Procedures (including critical care time)  Labs Reviewed - No data to display No results found.   1. Pain, dental       MDM  Patient with left upper toothache molar been bothering her for about a week. Minimal swelling to the left jaw area. Patient describes a purulent discharge from her gumline which was not observed no signs of an apical abscess on oral exam. Patient is being started on antibiotics and Tylenol #3 advised to followup with the dentist referral  provider for a local dentist Dr. Amie Critchley, MD 05/14/12 2109

## 2012-05-14 NOTE — ED Notes (Signed)
Pt  Reports symptoms  Of  l     Upper  Toothache      Symptoms  Started  Last   Week   She  Has  Some  Swelling  To  Her  l  Jaw    Area       Pt   Reports  Unable  To  Se  Dentist  Due  To  Finances          Pt  States  History  Of  Asthma  But is  In no  Acute  Distress  At this  Time

## 2012-05-22 ENCOUNTER — Telehealth (HOSPITAL_COMMUNITY): Payer: Self-pay | Admitting: *Deleted

## 2012-05-22 NOTE — ED Notes (Signed)
8/1 Pt. Called on VM. I called pt. back and she said she ws referred to a dentist that is not in the practice and does not take call. I called pt. back and told her Dr. Lucky Cowboy was on call 7/30 in our system. I gave her the Medicaid list of doctors and their numbers or if she wants the tooth pulled the number for [employer] [facility].  Vassie Moselle 05/22/2012

## 2012-05-27 ENCOUNTER — Ambulatory Visit (INDEPENDENT_AMBULATORY_CARE_PROVIDER_SITE_OTHER): Payer: Medicaid Other | Admitting: *Deleted

## 2012-05-27 DIAGNOSIS — Z0000 Encounter for general adult medical examination without abnormal findings: Secondary | ICD-10-CM

## 2012-05-27 DIAGNOSIS — Z111 Encounter for screening for respiratory tuberculosis: Secondary | ICD-10-CM

## 2012-05-29 ENCOUNTER — Ambulatory Visit: Payer: Medicaid Other | Admitting: *Deleted

## 2012-05-29 DIAGNOSIS — Z0000 Encounter for general adult medical examination without abnormal findings: Secondary | ICD-10-CM

## 2012-05-29 LAB — TB SKIN TEST: TB Skin Test: NEGATIVE

## 2012-05-31 ENCOUNTER — Emergency Department (HOSPITAL_COMMUNITY)
Admission: EM | Admit: 2012-05-31 | Discharge: 2012-05-31 | Disposition: A | Discharge status: Home / Self Care | Payer: Self-pay | Attending: Emergency Medicine | Admitting: Emergency Medicine

## 2012-05-31 ENCOUNTER — Encounter (HOSPITAL_COMMUNITY): Payer: Self-pay | Admitting: *Deleted

## 2012-05-31 DIAGNOSIS — J45909 Unspecified asthma, uncomplicated: Secondary | ICD-10-CM | POA: Insufficient documentation

## 2012-05-31 DIAGNOSIS — Z881 Allergy status to other antibiotic agents status: Secondary | ICD-10-CM | POA: Insufficient documentation

## 2012-05-31 DIAGNOSIS — K089 Disorder of teeth and supporting structures, unspecified: Secondary | ICD-10-CM | POA: Insufficient documentation

## 2012-05-31 DIAGNOSIS — K0889 Other specified disorders of teeth and supporting structures: Secondary | ICD-10-CM

## 2012-05-31 DIAGNOSIS — R87619 Unspecified abnormal cytological findings in specimens from cervix uteri: Secondary | ICD-10-CM | POA: Insufficient documentation

## 2012-05-31 DIAGNOSIS — Z888 Allergy status to other drugs, medicaments and biological substances status: Secondary | ICD-10-CM | POA: Insufficient documentation

## 2012-05-31 DIAGNOSIS — Z87891 Personal history of nicotine dependence: Secondary | ICD-10-CM | POA: Insufficient documentation

## 2012-05-31 DIAGNOSIS — Z8249 Family history of ischemic heart disease and other diseases of the circulatory system: Secondary | ICD-10-CM | POA: Insufficient documentation

## 2012-05-31 DIAGNOSIS — Z833 Family history of diabetes mellitus: Secondary | ICD-10-CM | POA: Insufficient documentation

## 2012-05-31 MED ORDER — OXYCODONE-ACETAMINOPHEN 5-325 MG PO TABS: 1.0000 | ORAL_TABLET | Freq: Four times a day (QID) | ORAL | Status: AC | PRN

## 2012-05-31 NOTE — ED Notes (Signed)
Pt reports several hx of intermittent left upper dental pain. Reports no relief with meds given already.

## 2012-05-31 NOTE — ED Provider Notes (Signed)
History   This chart was scribed for Norvell Caswell B. Bernette Mayers, MD by Melba Coon. The patient was seen in room TR09C/TR09C and the patient's care was started at 3:51PM.    CSN: [id number]  Arrival date & time 05/31/12  1542   First MD Initiated Contact with Patient 05/31/12 1547      Chief Complaint  Patient presents with  . Dental Pain    (Consider location/radiation/quality/duration/timing/severity/associated sxs/prior treatment) HPI Sandra Oneill is a 26 y.o. female who presents to the Emergency Department complaining of constant, moderate to severe, left upper dental pain with an onset last night. Pt was here 2 weeks ago for the same symptoms except pain has gotten progressively worse. Pt needs another referral to another dentist. No HA, fever, neck pain, sore throat, rash, back pain, CP, SOB, abd pain, n/v/d, dysuria, or extremity pain, edema, weakness, numbness, or tingling. Allergic to Amoxicillin and Sulfamethoxazole w-trimethoprim. No other pertinent medical symptoms.   Past Medical History  Diagnosis Date  . Abnormal Pap smear     3-4 years ago  . Asthma     History reviewed. No pertinent past surgical history.  Family History  Problem Relation Age of Onset  . Diabetes Maternal Grandmother   . Hypertension Mother     History  Substance Use Topics  . Smoking status: Former Smoker -- 0.1 packs/day    Types: Cigars    Quit date: 08/07/2011  . Smokeless tobacco: Never Used  . Alcohol Use: 1.5 oz/week    3 drink(s) per week    OB History    Grav Para Term Preterm Abortions TAB SAB Ect Mult Living   0               Review of Systems 10 Systems reviewed and all are negative for acute change except as noted in the HPI.   Allergies  Amoxicillin and Sulfamethoxazole w-trimethoprim  Home Medications   Current Outpatient Rx  Name Route Sig Dispense Refill  . ALBUTEROL SULFATE HFA 108 (90 BASE) MCG/ACT IN AERS  Inhale 1-2 puffs by mouth every 4 hours as  needed. 3 Inhaler 3  . FLUTICASONE-SALMETEROL 250-50 MCG/DOSE IN AEPB Inhalation Inhale 1 puff into the lungs 2 (two) times daily. 9 each 3  . IBUPROFEN 200 MG PO TABS Oral Take 200 mg by mouth every 6 (six) hours as needed.    . MOMETASONE FUROATE 50 MCG/ACT NA SUSP Nasal Place 2 sprays into the nose daily. 17 g 2  . POLYETHYL GLYCOL-POLYVINYL ALC 1-1 % OP SOLN Ophthalmic Apply 2 drops to eye daily. 1 Bottle e  . TRIAMCINOLONE ACETONIDE 0.1 % EX CREA  Apply to affected area in a thin layer 1-2 times a day for total of 14 days 30 g 2    BP 117/65  Pulse 75  Temp 99 F (37.2 C) (Oral)  Resp 16  SpO2 99%  Physical Exam  Constitutional: She is oriented to person, place, and time. She appears well-developed and well-nourished.  HENT:  Head: Normocephalic and atraumatic.       Prior dental work done in area of pain, but no remarkable findings.  Neck: Neck supple.  Pulmonary/Chest: Effort normal.  Neurological: She is alert and oriented to person, place, and time. No cranial nerve deficit.  Psychiatric: She has a normal mood and affect. Her behavior is normal.    ED Course  Procedures (including critical care time)  DIAGNOSTIC STUDIES: Oxygen Saturation is 99% on room air, normal by my  interpretation.    COORDINATION OF CARE:  3:54PM - percocet will be Rx for the pt. Pt needs to f/u with another dentist and will be getting another referral. Pt ready for d/c.   Labs Reviewed - No data to display No results found.   No diagnosis found.    MDM  Dental pain without evidence of abscess. Advised outpatient dental followup.   I personally performed the services described in the documentation, which were scribed in my presence. The recorded information has been reviewed and considered.          Micaiah Remillard B. Bernette Mayers, MD 05/31/12 [phone]

## 2012-06-12 ENCOUNTER — Other Ambulatory Visit: Payer: Self-pay | Admitting: *Deleted

## 2012-06-12 MED ORDER — MOMETASONE FUROATE 50 MCG/ACT NA SUSP: 2.0000 | Freq: Every day | NASAL | Status: DC

## 2012-06-12 NOTE — Telephone Encounter (Signed)
Called to pharm 

## 2012-06-13 ENCOUNTER — Encounter: Payer: Medicaid Other | Admitting: Internal Medicine

## 2012-06-27 ENCOUNTER — Ambulatory Visit (INDEPENDENT_AMBULATORY_CARE_PROVIDER_SITE_OTHER): Payer: Medicaid Other | Admitting: *Deleted

## 2012-06-27 VITALS — BP 127/86 | HR 72 | Wt 134.7 lb

## 2012-06-27 DIAGNOSIS — Z3049 Encounter for surveillance of other contraceptives: Secondary | ICD-10-CM

## 2012-06-27 MED ORDER — MEDROXYPROGESTERONE ACETATE 150 MG/ML IM SUSP
150.0000 mg | Freq: Once | INTRAMUSCULAR | Status: AC
  Administered 2012-06-27: 150 mg via INTRAMUSCULAR

## 2012-07-04 ENCOUNTER — Encounter: Payer: Self-pay | Admitting: Internal Medicine

## 2012-07-04 ENCOUNTER — Ambulatory Visit (INDEPENDENT_AMBULATORY_CARE_PROVIDER_SITE_OTHER): Payer: Medicaid Other | Admitting: Internal Medicine

## 2012-07-04 VITALS — BP 118/81 | HR 81 | Temp 98.5°F | Resp 20 | Ht 63.5 in | Wt 137.2 lb

## 2012-07-04 DIAGNOSIS — R87610 Atypical squamous cells of undetermined significance on cytologic smear of cervix (ASC-US): Secondary | ICD-10-CM

## 2012-07-04 DIAGNOSIS — Z0000 Encounter for general adult medical examination without abnormal findings: Secondary | ICD-10-CM

## 2012-07-04 DIAGNOSIS — R5383 Other fatigue: Secondary | ICD-10-CM

## 2012-07-04 DIAGNOSIS — R51 Headache: Secondary | ICD-10-CM

## 2012-07-04 DIAGNOSIS — J45909 Unspecified asthma, uncomplicated: Secondary | ICD-10-CM

## 2012-07-04 DIAGNOSIS — R5381 Other malaise: Secondary | ICD-10-CM

## 2012-07-04 DIAGNOSIS — Z23 Encounter for immunization: Secondary | ICD-10-CM

## 2012-07-04 DIAGNOSIS — J302 Other seasonal allergic rhinitis: Secondary | ICD-10-CM

## 2012-07-04 DIAGNOSIS — J309 Allergic rhinitis, unspecified: Secondary | ICD-10-CM

## 2012-07-04 MED ORDER — FLUTICASONE-SALMETEROL 250-50 MCG/DOSE IN AEPB: 1.0000 | INHALATION_SPRAY | Freq: Two times a day (BID) | RESPIRATORY_TRACT | Status: DC

## 2012-07-04 MED ORDER — ALBUTEROL SULFATE HFA 108 (90 BASE) MCG/ACT IN AERS: 2.0000 | INHALATION_SPRAY | Freq: Four times a day (QID) | RESPIRATORY_TRACT | Status: DC | PRN

## 2012-07-04 MED ORDER — MOMETASONE FUROATE 50 MCG/ACT NA SUSP: 2.0000 | Freq: Every day | NASAL | Status: DC

## 2012-07-04 MED ORDER — CETIRIZINE HCL 10 MG PO CAPS: 1.0000 | ORAL_CAPSULE | Freq: Every day | ORAL | Status: DC

## 2012-07-04 NOTE — Assessment & Plan Note (Signed)
She is occasionally woken up by her headaches. Even if she is not, she is tired by 10-11am and wants to take a nap. A sleep study was ordered at her clinic visit 12/12 but she did not have this done. Her headaches could be allergy/sinus related; we'll see how she does on the Zyrtec and Nasonex. There is a previous order for a sleep study. She just started a new job, and her ins begins in 90 days, so we will at least wait until her ins begins to send her for the sleep study.

## 2012-07-04 NOTE — Assessment & Plan Note (Addendum)
Asthma well controlled. Rare use of Albuterol. Denies any limitations 2/2 asthma.  Continuing Advair 250/50 1 puff BID and Albuterol Inh 2 p q6h PRN.  Consider PFTs in the future.

## 2012-07-04 NOTE — Progress Notes (Signed)
Patient ID: Sandra Oneill, female   DOB: 1985/11/23, 26 y.o.   MRN: [id number]  Subjective:   Patient ID: Sandra Oneill female   DOB: 1986-01-15 25 y.o.   MRN: [id number]  HPI: [name] is a 26 y.o. female w/ PMH asthma, eczema, and allergic rhinitis who presents to the clinic for her yearly followup and for a work evaluation for her new job.   She's history of headaches that began in December of 2012 MRI and MRA were both negative. The patient was prescribed naproxen but did not get the prescription filled. She was also referred for a sleep study but never had it done. She states that her headaches had resolved until last week. She says that she has headaches all over her head. They are a throbbing pain that occur primarily at night and improves throughout the day. She denies taking any medication but states that caffeine does help improve her headache. She has tried ibuprofen in the past which also helped improve the pain. She denies having to miss any work due to her headaches. Today she endorses mild pain in her right forehead/temple region, and states that her headaches might be due to sinus congestion. She does endorse a history of seasonal allergies with rhinorrhea nasal congestion runny nose. She does use Nasonex pills per day she states which does not necessarily improve her symptoms. She also is on Depo-Provera for birth control. She did receive a tip injection last Thursday, but states that her headaches restarted on Wednesday.    Past Medical History  Diagnosis Date  . Abnormal Pap smear     3-4 years ago  . Asthma    Current Outpatient Prescriptions  Medication Sig Dispense Refill  . albuterol (PROVENTIL HFA;VENTOLIN HFA) 108 (90 BASE) MCG/ACT inhaler Inhale 2 puffs into the lungs every 6 (six) hours as needed. For shortness of breath  1 Inhaler  6  . Fluticasone-Salmeterol (ADVAIR) 250-50 MCG/DOSE AEPB Inhale 1 puff into the lungs 2 (two) times daily.  1 each  11  .  mometasone (NASONEX) 50 MCG/ACT nasal spray Place 2 sprays into the nose daily.  17 g  6  . Polyethyl Glycol-Polyvinyl Alc (ARTIFICIAL TEARS) 1-1 % SOLN Apply 2 drops to eye daily.  1 Bottle  e  . triamcinolone cream (KENALOG) 0.1 % Apply to affected area in a thin layer 1-2 times a day for total of 14 days  30 g  2  . DISCONTD: Fluticasone-Salmeterol (ADVAIR) 250-50 MCG/DOSE AEPB Inhale 1 puff into the lungs 2 (two) times daily.  9 each  3  . DISCONTD: Fluticasone-Salmeterol (ADVAIR) 250-50 MCG/DOSE AEPB Inhale 1 puff into the lungs 2 (two) times daily.  9 each  11  . DISCONTD: mometasone (NASONEX) 50 MCG/ACT nasal spray Place 2 sprays into the nose daily.  17 g  2  . Cetirizine HCl 10 MG CAPS Take 1 capsule (10 mg total) by mouth at bedtime.  30 capsule  6  . ibuprofen (ADVIL,MOTRIN) 200 MG tablet Take 200 mg by mouth every 6 (six) hours as needed. For fever/pain       Current Facility-Administered Medications  Medication Dose Route Frequency Provider Last Rate Last Dose  . medroxyPROGESTERone (DEPO-PROVERA) injection 150 mg  150 mg Intramuscular Q90 days August Luz, CNM   150 mg at 04/10/12 1548   Family History  Problem Relation Age of Onset  . Diabetes Maternal Grandmother   . Hypertension Mother    History  Social History  . Marital Status: Single    Spouse Name: N/A    Number of Children: N/A  . Years of Education: N/A   Social History Main Topics  . Smoking status: Former Smoker -- 0.1 packs/day    Types: Cigars    Quit date: 08/07/2011  . Smokeless tobacco: Never Used  . Alcohol Use: 1.5 oz/week    3 drink(s) per week  . Drug Use: No  . Sexually Active: Not Currently    Birth Control/ Protection: Injection   Other Topics Concern  . None   Social History Narrative  . None   Review of Systems: Constitutional: Endorses fatigue. Denies fever, chills, diaphoresis, and appetite change.  HEENT: Endorses rhinorrhea, nasal congestion, and sneezing. Denies  photophobia, eye pain, redness, hearing loss, ear pain, mouth sores, trouble swallowing, neck pain, neck stiffness and tinnitus.   Respiratory: Denies SOB, DOE, cough, chest tightness,  and wheezing.   Cardiovascular: Denies chest pain, palpitations and leg swelling.  Gastrointestinal: Denies nausea, vomiting, abdominal pain, diarrhea, constipation, blood in stool and abdominal distention.  Genitourinary: Denies dysuria, urgency, frequency, hematuria, flank pain and difficulty urinating.  Musculoskeletal: Denies myalgias, back pain, joint swelling, arthralgias and gait problem.  Skin: Denies pallor, rash and wound.  Neurological: Endorses headaches. Denies dizziness, seizures, syncope, weakness, light-headedness, numbness and headaches.  Hematological: Denies adenopathy. Easy bruising, personal or family bleeding history  Psychiatric/Behavioral: Denies suicidal ideation, mood changes, confusion, nervousness, sleep disturbance and agitation  Objective:  Physical Exam: Filed Vitals:   07/04/12 1321  BP: 118/81  Pulse: 81  Temp: 98.5 F (36.9 C)  TempSrc: Oral  Resp: 20  Height: 5' 3.5" (1.613 m)  Weight: 137 lb 3.2 oz (62.234 kg)  SpO2: 98%   Constitutional: Vital signs reviewed.  Patient is a well-developed and well-nourished female in no acute distress and cooperative with exam. Alert and oriented x3.  Head: Normocephalic and atraumatic Mouth: no erythema or exudates, MMM Eyes: PERRL, EOMI, no scleral icterus.  Neck: Supple, Trachea midline normal ROM, No JVD, mass, or thyromegaly.  Cardiovascular: RRR, S1 normal, S2 normal, no MRG, pulses symmetric and intact bilaterally Pulmonary/Chest: CTAB, no wheezes, rales, or rhonchi Abdominal: Soft. Non-tender, non-distended, bowel sounds are normal, no masses, organomegaly, or guarding present. [employer] piercings. Large flower tattoo on right abd.  GU: no CVA tenderness Musculoskeletal: No joint deformities, erythema, or stiffness, ROM full  and no nontender Hematology: No cervical adenopathy.  Neurological: A&O x3, Strength is normal and symmetric bilaterally, cranial nerve II-XII are grossly intact, no focal motor deficit, sensory intact to light touch bilaterally.  Skin: Warm, dry and intact. No rash, cyanosis, or clubbing.  Psychiatric: Normal mood and affect. speech and behavior is normal. Judgment and thought content normal. Cognition and memory are normal.   Assessment & Plan:   Please refer to Problem List based A&P.

## 2012-07-04 NOTE — Patient Instructions (Addendum)
Continue using your Advair and Albuterol inhalers as prescribed.   Continue taking the Nasonex nasal spray. You can try using a saline nasal spray, such as Ocean prior to using the Nasonex. Spray 1 spray in each nostril and blow your nose well. This will help lubricate and clean out your nasal passages, and can help make the Nasonex more effective.   Start Zyrtec 10mg  one tablet each night. This may make you drowsy initally, so be mindful of this.  Try taking Ibuprofen 400mg  every 6 hours as needed for your headache.  If your headache does not improve or worsens, please call the clinic or go to the Emergency Department.  If your sleep does not improve, please call the clinic, and we will send you for the sleep study.  Please follow up with me in 1 year.   Headache and Allergies The relationship between allergies and headaches is unclear. Many people with allergic or infectious nasal problems also have headaches (migraines or sinus headaches). However, sometimes allergies can cause pressure that feels like a headache, and sometimes headaches can cause allergy-like symptoms. It is not always clear whether your symptoms are caused by allergies or by a headache. CAUSES    Migraine: The cause of a migraine is not always known.   Sinus Headache: The cause of a sinus headache may be a sinus infection. Other conditions that may be related to sinus headaches include:   Hay fever (allergic rhinitis).   Deviation of the nasal septum.   Swelling or clogging of the nasal passages.  SYMPTOMS   Migraine headache symptoms (which often last 4 to 72 hours) include:  Intense, throbbing pain on one or both sides of the head.   Nausea.   Vomiting.   Being extra sensitive to light.   Being extra sensitive to sound.   Nervous system reactions that appear similar to an allergic reaction:   Stuffy nose.   Runny nose.   Tearing.  Sinus headaches are felt as facial pain or pressure.   DIAGNOSIS    Because there is some overlap in symptoms, sinus and migraine headaches are often misdiagnosed. For example, a person with migraines may also feel facial pressure. Likewise, many people with hay fever may get migraine headaches rather than sinus headaches. These migraines can be triggered by the histamine release during an allergic reaction. An antihistamine medicine can eliminate this pain. There are standard criteria that help clarify the difference between these headaches and related allergy or allergy-like symptoms. Your caregiver can use these criteria to determine the proper diagnosis and provide you the best care. TREATMENT   Migraine medicine may help people who have persistent migraine headaches even though their hay fever is controlled. For some people, anti-inflammatory treatments do not work to relieve migraines. Medicines called triptans (such as sumatriptan) can be helpful for those people. Document Released: 12/23/2003 Document Revised: 09/21/2011 Document Reviewed: 01/14/2010 [facility] Patient Information 2012 [place], [place].

## 2012-07-04 NOTE — Assessment & Plan Note (Signed)
Pt w/ allergic rhinitis, nasal congestion, and sneezing in Spring and Fall. On Nasonex w/o much relief. Recommended using saline nasal spray and blowing nose well prior to using Nasonex. Also prescribed Zyrtec 10mg  at bedtime. Alerted pt that medication can cause drowsiness, so take a few hrs before bedtime, after home for the evening, to avoid residual drowsiness in the morning.

## 2012-07-04 NOTE — Assessment & Plan Note (Signed)
Pt was referred for sleep study but did not have it done. She was also rx'd Naproxen but did not have it filled. Her h/a had resolved and just started back last week. Possibly due to sinus headaches and nasal congestion.  Starting Zyrtec to see if this improves her allergies, sleep, and headaches.  She can also try Ibuprofen 400mg  q6h PRN headaches.  She is to call if the h/a do not improve or worsen.

## 2012-07-05 NOTE — Progress Notes (Signed)
I saw, examined, and discussed the patient with Dr Sherrine Maples and agree with the note contained here.

## 2012-08-22 ENCOUNTER — Encounter: Payer: Medicaid Other | Admitting: Internal Medicine

## 2012-09-16 ENCOUNTER — Ambulatory Visit: Payer: Medicaid Other

## 2012-09-20 ENCOUNTER — Ambulatory Visit (INDEPENDENT_AMBULATORY_CARE_PROVIDER_SITE_OTHER): Payer: Medicaid Other | Admitting: *Deleted

## 2012-09-20 VITALS — BP 109/77 | HR 77 | Temp 99.6°F | Wt 133.2 lb

## 2012-09-20 DIAGNOSIS — Z3049 Encounter for surveillance of other contraceptives: Secondary | ICD-10-CM

## 2012-09-20 MED ORDER — MEDROXYPROGESTERONE ACETATE 150 MG/ML IM SUSP
150.0000 mg | Freq: Once | INTRAMUSCULAR | Status: AC
  Administered 2012-09-20: 150 mg via INTRAMUSCULAR

## 2012-09-20 NOTE — Progress Notes (Signed)
Here for depoprovera. Patient states she is considering switching birth control, but not sure which. Given written information discussing birth control options and informed patient her next appointment should be for annual exam and can discuss birth control options then as well as get shot if she elects to.

## 2012-11-20 ENCOUNTER — Encounter: Payer: Self-pay | Admitting: Physician Assistant

## 2012-11-20 NOTE — Progress Notes (Signed)
  Subjective:     Sandra Oneill is a 27 y.o. G0 female and is here for a comprehensive physical exam. The patient reports no problems. She wants to be on Depo Provera for contraception, no contraindications and no side effects in the past.  History   Social History  . Marital Status: Single    Spouse Name: N/A    Number of Children: N/A  . Years of Education: N/A   Occupational History  . Not on file.   Social History Main Topics  . Smoking status: Former Smoker -- 0.1 packs/day    Types: Cigars    Quit date: 08/07/2011  . Smokeless tobacco: Never Used  . Alcohol Use: 1.5 oz/week    3 drink(s) per week  . Drug Use: No  . Sexually Active: Not Currently    Birth Control/ Protection: Injection   Other Topics Concern  . Not on file   Social History Narrative  . No narrative on file   Health Maintenance  Topic Date Due  . Influenza Vaccine  06/16/2013  . Pap Smear  11/07/2014  . Tetanus/tdap  11/07/2021    The following portions of the patient's history were reviewed and updated as appropriate: allergies, current medications, past family history, past medical history, past social history, past surgical history and problem list.  Review of Systems Pertinent items are noted in HPI.   Objective:   BP 124/77  Pulse 100  Temp 99 F (37.2 C) (Oral)  Ht 5' 2.5" (1.588 m)  Wt 124 lb (56.246 kg)  BMI 22.32 kg/m2 GENERAL: Well-developed, well-nourished female in no acute distress.  HEENT: Normocephalic, atraumatic. Sclerae anicteric.  NECK: Supple. Normal thyroid.  LUNGS: Clear to auscultation bilaterally.  HEART: Regular rate and rhythm. BREASTS: Symmetric in size. No masses, skin changes, nipple drainage, or lymphadenopathy. ABDOMEN: Soft, nontender, nondistended. No organomegaly. PELVIC: Normal external female genitalia. Vagina is pink and rugated.  Normal discharge. Normal cervix contour. Pap smear obtained. Uterus is normal in size. No adnexal mass or tenderness.   EXTREMITIES: No cyanosis, clubbing, or edema, 2+ distal pulses.   Assessment:    Healthy female exam. Contraception Preventative health maintenance Plan:   Follow up pap smear TDaP vaccine given; patient declines flu vaccine Depo Provera injection given, return in 3 months See After Visit Summary for Counseling Recommendations

## 2012-11-27 ENCOUNTER — Ambulatory Visit (INDEPENDENT_AMBULATORY_CARE_PROVIDER_SITE_OTHER): Payer: Medicare FFS | Admitting: Internal Medicine

## 2012-11-27 ENCOUNTER — Telehealth: Payer: Self-pay | Admitting: *Deleted

## 2012-11-27 VITALS — BP 105/70 | HR 76 | Temp 99.2°F | Ht 64.2 in | Wt 136.2 lb

## 2012-11-27 DIAGNOSIS — R1012 Left upper quadrant pain: Secondary | ICD-10-CM

## 2012-11-27 DIAGNOSIS — R112 Nausea with vomiting, unspecified: Secondary | ICD-10-CM

## 2012-11-27 LAB — COMPLETE METABOLIC PANEL WITH GFR
Albumin: 5.3 g/dL — ABNORMAL HIGH (ref 3.5–5.2)
Alkaline Phosphatase: 62 U/L (ref 39–117)
BUN: 10 mg/dL (ref 6–23)
CO2: 24 mEq/L (ref 19–32)
GFR, Est African American: >89 mL/min
GFR, Est Non African American: >89 mL/min
Glucose, Bld: 81 mg/dL (ref 70–99)
Potassium: 3.9 mEq/L (ref 3.5–5.3)
Sodium: 138 mEq/L (ref 135–145)
Total Bilirubin: 0.4 mg/dL (ref 0.3–1.2)
Total Protein: 7.9 g/dL (ref 6.0–8.3)

## 2012-11-27 LAB — CBC
HCT: 38.9 % (ref 36.0–46.0)
Hemoglobin: 12.9 g/dL (ref 12.0–15.0)
MCH: 29.6 pg (ref 26.0–34.0)
MCHC: 33.2 g/dL (ref 30.0–36.0)
RBC: 4.36 MIL/uL (ref 3.87–5.11)

## 2012-11-27 MED ORDER — PROMETHAZINE HCL 12.5 MG PO TABS: 12.5000 mg | ORAL_TABLET | Freq: Four times a day (QID) | ORAL | Status: DC | PRN

## 2012-11-27 NOTE — Patient Instructions (Addendum)
General Instructions: Please schedule a follow up appointment as needed . Please bring your medication bottles with your next appointment. Please take your medicines as prescribed. Please drink plenty of fluids. Please take phenergan for your nausea.    Treatment Goals:  Goals (1 Years of Data) as of 11/27/12   None

## 2012-11-27 NOTE — Progress Notes (Signed)
Subjective:   Patient ID: Sandra Oneill female   DOB: 07/10/1986 27 y.o.   MRN: [id number]  HPI: 27 year old woman with past medical history significant for asthma presents to the clinic for an acute visit for abdominal symptoms for 1 week.  Patient reports that she has been having abdominal pain for last one week( since Friday). She describes it as dull achy constant discomfort on the left side of her abdomen, rates her pain 8/10, worse with vomiting yesterday. She states that she took clindamycin for her tooth infection from 28/3/14 till 28/7/14 but stopped it early on 2/7 ( although she was suppose to take it for 7- 10 days)when she got some diarrhea along with the abdominal pain. She describes her diarrhea as loose stools and had had a frequency of 3 episodes on Friday, then decreased to 2 episodes on Saturday and Sunday and have completely stopped. Non bloody, non mucous. Also denies any fever or chills. She did not move her bowels at all yesterday. She started getting little nauseous and threw up yesterday. She has had 2 episodes of vomiting so far- describes it as nonbilious and nonbloody. 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 She reports that one of her colleagues just  probably had a stomach flu.   Past Medical History  Diagnosis Date  . Abnormal Pap smear     3-4 years ago  . Asthma    Family History  Problem Relation Age of Onset  . Diabetes Maternal Grandmother   . Hypertension Mother    History   Social History  . Marital Status: Single    Spouse Name: N/A    Number of Children: N/A  . Years of Education: N/A   Occupational History  . Not on file.   Social History Main Topics  . Smoking status: Former Smoker -- 0.10 packs/day    Types: Cigars    Quit date: 08/07/2011  . Smokeless tobacco: Never Used  . Alcohol Use: 1.5 oz/week    3 drink(s) per week  . Drug Use: No  . Sexually Active: Not Currently    Birth Control/ Protection: Injection   Other Topics Concern   . Not on file   Social History Narrative  . No narrative on file   Review of Systems: General: Denies fever, chills, diaphoresis, appetite change and fatigue. HEENT: Denies photophobia, eye pain, redness, hearing loss, ear pain, congestion, sore throat, rhinorrhea, sneezing, mouth sores, trouble swallowing, neck pain, neck stiffness and tinnitus. Respiratory: Denies SOB, DOE, cough, chest tightness, and wheezing. Cardiovascular: Denies to chest pain, palpitations and leg swelling. Gastrointestinal: Denies nausea,  abdominal pain, + diarrhea ( soft stools- twice a day), constipation, blood in stool and abdominal distention, + vomiting( 2 episodes) Genitourinary: Denies dysuria, urgency, frequency, hematuria, flank pain and difficulty urinating. Musculoskeletal: Denies myalgias, back pain, joint swelling, arthralgias and gait problem.  Skin: Denies pallor, rash and wound. Neurological: Denies dizziness, seizures, syncope, weakness, light-headedness, numbness and headaches. Hematological: Denies adenopathy, easy bruising, personal or family bleeding history. Psychiatric/Behavioral: Denies suicidal ideation, mood changes, confusion, nervousness, sleep disturbance and agitation.    Current Outpatient Medications: Current Outpatient Prescriptions  Medication Sig Dispense Refill  . albuterol (PROVENTIL HFA;VENTOLIN HFA) 108 (90 BASE) MCG/ACT inhaler Inhale 2 puffs into the lungs every 6 (six) hours as needed. For shortness of breath  1 Inhaler  6  . Cetirizine HCl 10 MG CAPS Take 1 capsule (10 mg total) by mouth at bedtime.  30 capsule  6  .  Fluticasone-Salmeterol (ADVAIR) 250-50 MCG/DOSE AEPB Inhale 1 puff into the lungs 2 (two) times daily.  1 each  11  . ibuprofen (ADVIL,MOTRIN) 200 MG tablet Take 200 mg by mouth every 6 (six) hours as needed. For fever/pain      . mometasone (NASONEX) 50 MCG/ACT nasal spray Place 2 sprays into the nose daily.  17 g  6  . Polyethyl Glycol-Polyvinyl Alc  (ARTIFICIAL TEARS) 1-1 % SOLN Apply 2 drops to eye daily.  1 Bottle  e  . triamcinolone cream (KENALOG) 0.1 % Apply to affected area in a thin layer 1-2 times a day for total of 14 days  30 g  2   No current facility-administered medications for this visit.    Allergies: Allergies  Allergen Reactions  . Amoxicillin     REACTION: Nose bleeds and dizziness  . Sulfamethoxazole W-Trimethoprim     REACTION: itching or arms, legs, and lips as well as tingling of lips      Objective:   Physical Exam: Filed Vitals:   11/27/12 1328  BP: 105/70  Pulse: 76  Temp: 99.2 F (37.3 C)    General: Vital signs reviewed and noted. Well-developed, well-nourished, in no acute distress; alert, appropriate and cooperative throughout examination. Head: Normocephalic, atraumatic Lungs: Normal respiratory effort. Clear to auscultation BL without crackles or wheezes. Heart: RRR. S1 and S2 normal without gallop, murmur, or rubs. Abdomen:BS normoactive. Soft, Nondistended, non-tender.  No masses or organomegaly. Extremities: No pretibial edema.     Assessment & Plan:

## 2012-11-27 NOTE — Telephone Encounter (Signed)
Pt calls and states x 1 week she has N&V, diarrhea, abd pain and slight cough. She was on abx last week, did not finish them because she thought they were causing the abd pain and diarrhea, stopped them Friday 2/7. She wishes to be seen, appt 1315 dr Dorthula Rue

## 2012-11-27 NOTE — Assessment & Plan Note (Addendum)
Patient presents with left-sided abdominal pain for a week associated with some nausea and vomiting for 2 days. Her vomitus was nonbilious and nonbloody. She took clindamycin for her tooth infection but stopped the medication on 11/22/2012 because of development of some diarrhea.  She works at a daycare. I think her abdominal pain, nausea and vomiting is likely secondary to viral illness. I do not think that clindamycin is contributing to her symptoms with resolution of diarrheal symptoms as of today. -Supportive care. -Phenergan for nausea -Drink plenty of fluids -Check CBC, CMP -Go to the ER if symptoms get worse or she starts feeling dizzy, lightheaded etc.

## 2012-11-29 NOTE — Telephone Encounter (Signed)
Patient was seen in the clinic.  Thanks, [employer]

## 2012-12-06 ENCOUNTER — Encounter: Payer: Self-pay | Admitting: Medical

## 2012-12-06 ENCOUNTER — Other Ambulatory Visit (HOSPITAL_COMMUNITY)
Admission: RE | Admit: 2012-12-06 | Discharge: 2012-12-06 | Disposition: A | Discharge status: Home / Self Care | Payer: Medicare FFS | Source: Ambulatory Visit | Attending: Obstetrics & Gynecology | Admitting: Obstetrics & Gynecology

## 2012-12-06 ENCOUNTER — Ambulatory Visit: Payer: Medicaid Other

## 2012-12-06 ENCOUNTER — Ambulatory Visit (INDEPENDENT_AMBULATORY_CARE_PROVIDER_SITE_OTHER): Payer: Medicare FFS | Admitting: Obstetrics & Gynecology

## 2012-12-06 VITALS — BP 127/90 | HR 90 | Temp 97.1°F | Ht 62.0 in | Wt 134.0 lb

## 2012-12-06 DIAGNOSIS — Z124 Encounter for screening for malignant neoplasm of cervix: Secondary | ICD-10-CM | POA: Insufficient documentation

## 2012-12-06 DIAGNOSIS — Z01419 Encounter for gynecological examination (general) (routine) without abnormal findings: Secondary | ICD-10-CM

## 2012-12-06 DIAGNOSIS — Z113 Encounter for screening for infections with a predominantly sexual mode of transmission: Secondary | ICD-10-CM | POA: Insufficient documentation

## 2012-12-06 NOTE — Progress Notes (Signed)
Patient ID: Sandra Oneill, female   DOB: 07-Jul-1986, 27 y.o.   MRN: [id number] Subjective:    Subjective:     Sandra Oneill is a 27 y.o. female here for a routine exam.  Current complaints: pt c/o shooting vulvar pain every 2 months or so.  They are very short in duration.  They do not affect her ADL's.  She is 'not very' sexually active' and does not want to continue the Depo Provera that she has been on. Does not want to be pregnant but, does not want any other contraception presently.   Gynecologic History No LMP recorded. Patient has had an injection. Contraception: Depo Provera (plans to stop today) Last Pap: 1/13. Results were: normal Last mammogram: n/a. Results were: n/a  Obstetric History OB History   Grav Para Term Preterm Abortions TAB SAB Ect Mult Living   0                The following portions of the patient's history were reviewed and updated as appropriate: allergies, current medications, past family history, past medical history, past social history, past surgical history and problem list.  Review of Systems Pertinent items are noted in HPI.    Objective:    BP 127/90  Pulse 90  Temp(Src) 97.1 F (36.2 C) (Oral)  Ht 5\' 2"  (1.575 m)  Wt 134 lb (60.782 kg)  BMI 24.5 kg/m2  General Appearance:    Alert, cooperative, no distress, appears stated age  Head:    Normocephalic, without obvious abnormality, atraumatic              Neck:   Supple, symmetrical, trachea midline, no adenopathy;    thyroid:  no enlargement/tenderness/nodules; no carotid   bruit or JVD  Back:     Symmetric, no curvature, ROM normal, no CVA tenderness  Lungs:     Clear to auscultation bilaterally, respirations unlabored  Chest Wall:    No tenderness or deformity   Heart:    Regular rate and rhythm, S1 and S2 normal, no murmur, rub   or gallop  Breast Exam:    No tenderness, masses, or nipple abnormality  Abdomen:     Soft, non-tender, bowel sounds active all four quadrants,    no  masses, no organomegaly  Genitalia:    Normal female without lesion, discharge or tenderness  GU: EGBUS: no lesions Vagina: no blood in vault Cervix: no lesion; no mucopurulent d/c Uterus: small, mobile Adnexa: no masses; non tender       Extremities:   Extremities normal, atraumatic, no cyanosis or edema  Pulses:   2+ and symmetric all extremities  Skin:   Skin color, texture, turgor normal, no rashes or lesions    Assessment:    Healthy female exam Contraception counseling- does not want any meds at present    Plan:    Education reviewed: safe sex/STD prevention. F/u 1 year or sooner prn Reviewed labs from ER visit earlier this month

## 2012-12-06 NOTE — Patient Instructions (Addendum)
Pap Test A Pap test is a procedure done in a clinic office to evaluate cells that are on the surface of the cervix. The cervix is the lower portion of the uterus and upper portion of the vagina. For some women, the cervical region has the potential to form cancer. With consistent evaluations by your caregiver, this type of cancer can be prevented.  If a Pap test is abnormal, it is most often a result of a previous exposure to human papillomavirus (HPV). HPV is a virus that can infect the cells of the cervix and cause dysplasia. Dysplasia is where the cells no longer look normal. If a woman has been diagnosed with high-grade or severe dysplasia, they are at higher risk of developing cervical cancer. People diagnosed with low-grade dysplasia should still be seen by their caregiver because there is a small chance that low-grade dysplasia could develop into cancer.  LET YOUR CAREGIVER KNOW ABOUT:  Recent sexually transmitted infection (STI) you have had.  Any new sex partners you have had.  History of previous abnormal Pap tests results.  History of previous cervical procedures you have had (colposcopy, biopsy, loop electrosurgical excision procedure [LEEP]).  Concerns you have had regarding unusual vaginal discharge.  History of pelvic pain.  Your use of birth control. BEFORE THE PROCEDURE  Ask your caregiver when to schedule your Pap test. It is best not to be on your period if your caregiver uses a wooden spatula to collect cells or applies cells to a glass slide. Newer techniques are not so sensitive to the timing of a menstrual cycle.  Do not douche or have sexual intercourse for 24 hours before the test.   Do not use vaginal creams or tampons for 24 hours before the test.   Empty your bladder just before the test to lessen any discomfort.  PROCEDURE You will lie on an exam table with your feet in stirrups. A warm metal or plastic instrument (speculum) is placed in your vagina. This  instrument allows your caregiver to see the inside of your vagina and look at your cervix. A small, plastic brush or wooden spatula is then used to collect cervical cells. These cells are placed in a lab specimen container. The cells are looked at under a microscope. A specialist will determine if the cells are normal.  AFTER THE PROCEDURE Make sure to get your test results.If your results come back abnormal, you may need further testing.  Document Released: 12/23/2002 Document Revised: 12/25/2011 Document Reviewed: 09/28/2011 [facility] Patient Information 2013 [place], [place]. How to Avoid Diabetes Problems You can do a lot to prevent or slow down diabetes problems. Following your diabetes plan and taking care of yourself can reduce your risk of serious or life-threatening complications. Below, you will find certain things you can do to prevent diabetes problems. MANAGE YOUR DIABETES Follow your caregiver's, nurse educator's, and dietician's instructions for managing your diabetes. They will teach you the basics of diabetes care. They can help answer questions you may have. Learn about diabetes and make healthy choices regarding eating and physical activity. Monitor your blood glucose level regularly. Your caregiver will help you decide how often to check your blood glucose level depending on your treatment goals and how well you are meeting them.  DO NOT SMOKE Smoking and diabetes are a dangerous combination. Smoking raises your risk for diabetes problems. If you quit smoking, you will lower your risk for heart attack, stroke, nerve disease, and kidney disease. Your cholesterol and your  blood pressure levels may improve. Your blood circulation will also improve. If you smoke, ask your caregiver for help in quitting. KEEP YOUR BLOOD PRESSURE UNDER CONTROL Keeping your blood pressure under control will help prevent damage to your eyes, kidneys, heart, and blood vessels. Blood pressure consists of 2  numbers. The top number should be below 130, and the bottom number should be below 80 (130/80). Keep your blood pressure as close to these numbers as you can. If you already have kidney disease, you may want even lower blood pressure to protect your kidneys. Talk to your caregiver to make sure that your blood pressure goal is right for your needs. Meal planning, medicines, and exercise can help you reach your blood pressure target. Have your blood pressure checked at every visit with your caregiver. KEEP YOUR CHOLESTEROL UNDER CONTROL Normal cholesterol levels will help prevent heart disease and stroke. These are the biggest health problems for people with diabetes. Keeping cholesterol levels under control can also help with blood flow. Have your cholesterol level checked at least once a year. Meal planning, exercise, and medicines can help you reach your cholesterol targets. SCHEDULE AND KEEP YOUR ANNUAL PHYSICAL EXAMS AND EYE EXAMS Your caregiver will tell you how often he or she wants to see you depending on your plan of treatment. It is important that you keep these appointments so that possible problems can be identified early and complications can be avoided or treated.  Every visit with your caregiver should include your weight, blood pressure, and an evaluation of your blood glucose control.  Your hemoglobin A1c should be checked:  At least twice a year if you are at your goal.  Every 3 months if there are changes in treatment.  If you are not meeting your goals.  Your blood lipids should be checked yearly. You should also be checked yearly to see if you have protein in your urine (microalbumin).  Schedule a dilated eye exam if you have type 1 diabetes within 5 years of your diagnosis and then yearly. Schedule a dilated eye exam if you have type 2 diabetes at diagnosis and then yearly. All exams thereafter can be extended to every 2 to 3 years if one or more exams have been normal. KEEP  YOUR VACCINES CURRENT The flu vaccine is recommended yearly. The formula for the vaccine changes every year and needs to be updated for the best protection against current viruses. In addition, you should get a vaccination against pneumonia at least once in your life. However, there are some instances where another vaccine is recommended. Check with your caregiver. TAKE CARE OF YOUR FEET  Diabetes may cause you to have a poor blood supply (circulation) to your legs and feet. Because of this, the skin may be thinner, break easier, and heal more slowly. You also may have nerve damage in your legs and feet causing decreased feeling. You may not notice minor injuries to your feet that could lead to serious problems or infections. Taking care of your feet is very important. Visual foot exams are performed at every routine medical visit. The exams check for cuts, injuries, or other problems with the feet. A comprehensive foot exam should be done yearly. This includes visual inspection as well as assessing foot pulses and testing for loss of sensation. You should also do the following:  Inspect your feet daily for cuts, calluses, blisters, ingrown toenails, and signs of infection, such as redness, swelling, or pus.  Wash and dry your  feet thoroughly, especially between the toes.  Avoid soaking your feet regularly in hot water baths.  Moisturize dry skin with lotion, avoiding areas between your toes.  Cut toenails straight across and file the edges.  Avoid shoes that do not fit well or have areas that irritate your skin.  Avoid going barefooted or wearing only socks. Your feet need protection. TAKE CARE OF YOUR TEETH People with poorly controlled diabetes are more likely to have gum (periodontal) disease. These infections make diabetes harder to control. Periodontal diseases, if left untreated, can lead to tooth loss. Brush your teeth twice a day, floss, and see your dentist for checkups and cleaning  every 6 months, or 2 times a year. ASK YOUR CAREGIVER ABOUT TAKING ASPIRIN Taking aspirin daily is recommended to help prevent cardiovascular disease in people with and without diabetes. Ask your caregiver if this would benefit you and what dose he or she would recommend. DRINK RESPONSIBLY Moderate amounts of alcohol (less than 1 drink per day for adult women and less than 2 drinks per day for adult men) have a minimal effect on blood glucose if ingested with food. It is important to eat food with alcohol to avoid hypoglycemia. People should avoid alcohol if they have a history of alcohol abuse or dependence, if they are pregnant, and if they have liver disease, pancreatitis, advanced neuropathy, or severe hypertriglyceridemia. LESSEN STRESS Living with diabetes can be stressful. When you are under stress, your blood glucose may be affected in two ways:  Stress hormones may cause your blood glucose to rise.  You may be distracted from taking good care of yourself. It is a good idea to be aware of your stress level and make changes that are necessary to help you better manage challenging situations. Support groups, planned relaxation, a hobby you enjoy, meditation, healthy relationships, and exercise all work to lower your stress level. If your efforts do not seem to be helping, get help from your caregiver or a trained mental health professional. Document Released: 06/20/2011 Document Revised: 12/25/2011 Document Reviewed: 06/20/2011 [facility] Patient Information 2013 [place], [place]. Contraception Choices Contraception (birth control) is the use of any methods or devices to prevent pregnancy. Below are some methods to help avoid pregnancy. HORMONAL METHODS   Contraceptive implant. This is a thin, plastic tube containing progesterone hormone. It does not contain estrogen hormone. Your caregiver inserts the tube in the inner part of the upper arm. The tube can remain in place for up to 3 years. After  3 years, the implant must be removed. The implant prevents the ovaries from releasing an egg (ovulation), thickens the cervical mucus which prevents sperm from entering the uterus, and thins the lining of the inside of the uterus.  Progesterone-only injections. These injections are given every 3 months by your caregiver to prevent pregnancy. This synthetic progesterone hormone stops the ovaries from releasing eggs. It also thickens cervical mucus and changes the uterine lining. This makes it harder for sperm to survive in the uterus.  Birth control pills. These pills contain estrogen and progesterone hormone. They work by stopping the egg from forming in the ovary (ovulation). Birth control pills are prescribed by a caregiver.Birth control pills can also be used to treat heavy periods.  Minipill. This type of birth control pill contains only the progesterone hormone. They are taken every day of each month and must be prescribed by your caregiver.  Birth control patch. The patch contains hormones similar to those in birth control pills.  It must be changed once a week and is prescribed by a caregiver.  Vaginal ring. The ring contains hormones similar to those in birth control pills. It is left in the vagina for 3 weeks, removed for 1 week, and then a new one is put back in place. The patient must be comfortable inserting and removing the ring from the vagina.A caregiver's prescription is necessary.  Emergency contraception. Emergency contraceptives prevent pregnancy after unprotected sexual intercourse. This pill can be taken right after sex or up to 5 days after unprotected sex. It is most effective the sooner you take the pills after having sexual intercourse. Emergency contraceptive pills are available without a prescription. Check with your pharmacist. Do not use emergency contraception as your only form of birth control. BARRIER METHODS   Female condom. This is a thin sheath (latex or rubber) that  is worn over the penis during sexual intercourse. It can be used with spermicide to increase effectiveness.  Female condom. This is a soft, loose-fitting sheath that is put into the vagina before sexual intercourse.  Diaphragm. This is a soft, latex, dome-shaped barrier that must be fitted by a caregiver. It is inserted into the vagina, along with a spermicidal jelly. It is inserted before intercourse. The diaphragm should be left in the vagina for 6 to 8 hours after intercourse.  Cervical cap. This is a round, soft, latex or plastic cup that fits over the cervix and must be fitted by a caregiver. The cap can be left in place for up to 48 hours after intercourse.  Sponge. This is a soft, circular piece of polyurethane foam. The sponge has spermicide in it. It is inserted into the vagina after wetting it and before sexual intercourse.  Spermicides. These are chemicals that kill or block sperm from entering the cervix and uterus. They come in the form of creams, jellies, suppositories, foam, or tablets. They do not require a prescription. They are inserted into the vagina with an applicator before having sexual intercourse. The process must be repeated every time you have sexual intercourse. INTRAUTERINE CONTRACEPTION  Intrauterine device (IUD). This is a T-shaped device that is put in a woman's uterus during a menstrual period to prevent pregnancy. There are 2 types:  Copper IUD. This type of IUD is wrapped in copper wire and is placed inside the uterus. Copper makes the uterus and fallopian tubes produce a fluid that kills sperm. It can stay in place for 10 years.  Hormone IUD. This type of IUD contains the hormone progestin (synthetic progesterone). The hormone thickens the cervical mucus and prevents sperm from entering the uterus, and it also thins the uterine lining to prevent implantation of a fertilized egg. The hormone can weaken or kill the sperm that get into the uterus. It can stay in  place for 5 years. PERMANENT METHODS OF CONTRACEPTION  Female tubal ligation. This is when the woman's fallopian tubes are surgically sealed, tied, or blocked to prevent the egg from traveling to the uterus.  Female sterilization. This is when the female has the tubes that carry sperm tied off (vasectomy).This blocks sperm from entering the vagina during sexual intercourse. After the procedure, the man can still ejaculate fluid (semen). NATURAL PLANNING METHODS  Natural family planning. This is not having sexual intercourse or using a barrier method (condom, diaphragm, cervical cap) on days the woman could become pregnant.  Calendar method. This is keeping track of the length of each menstrual cycle and identifying when you are  fertile.  Ovulation method. This is avoiding sexual intercourse during ovulation.  Symptothermal method. This is avoiding sexual intercourse during ovulation, using a thermometer and ovulation symptoms.  Post-ovulation method. This is timing sexual intercourse after you have ovulated. Regardless of which type or method of contraception you choose, it is important that you use condoms to protect against the transmission of sexually transmitted diseases (STDs). Talk with your caregiver about which form of contraception is most appropriate for you. Document Released: 10/02/2005 Document Revised: 12/25/2011 Document Reviewed: 02/08/2011 [facility] Patient Information 2013 [place], [place].

## 2013-02-06 ENCOUNTER — Telehealth: Payer: Self-pay | Admitting: *Deleted

## 2013-02-06 NOTE — Telephone Encounter (Signed)
Pt called stating for past few weeks after eating she feels nauseated.  She had the same problem at last visit and was given phenergan which helps but makes her sleepy and she can't take this while working.   Is there another med she can take?  She is taking 1/2 of 12.5 mg phenergan at present. Also concern as to why she has this nausea.  Pt # [id number]

## 2013-02-10 NOTE — Telephone Encounter (Signed)
Pt scheduled for OV.  

## 2013-02-18 ENCOUNTER — Emergency Department (HOSPITAL_COMMUNITY)
Admission: EM | Admit: 2013-02-18 | Discharge: 2013-02-18 | Disposition: A | Discharge status: Home / Self Care | Payer: Managed Care, Other (non HMO) | Attending: Emergency Medicine | Admitting: Emergency Medicine

## 2013-02-18 ENCOUNTER — Encounter (HOSPITAL_COMMUNITY): Payer: Self-pay | Admitting: Emergency Medicine

## 2013-02-18 ENCOUNTER — Ambulatory Visit: Payer: Medicare FFS | Admitting: Internal Medicine

## 2013-02-18 DIAGNOSIS — Z79899 Other long term (current) drug therapy: Secondary | ICD-10-CM | POA: Insufficient documentation

## 2013-02-18 DIAGNOSIS — IMO0002 Reserved for concepts with insufficient information to code with codable children: Secondary | ICD-10-CM | POA: Insufficient documentation

## 2013-02-18 DIAGNOSIS — R221 Localized swelling, mass and lump, neck: Secondary | ICD-10-CM | POA: Insufficient documentation

## 2013-02-18 DIAGNOSIS — K089 Disorder of teeth and supporting structures, unspecified: Secondary | ICD-10-CM | POA: Insufficient documentation

## 2013-02-18 DIAGNOSIS — Z87891 Personal history of nicotine dependence: Secondary | ICD-10-CM | POA: Insufficient documentation

## 2013-02-18 DIAGNOSIS — R220 Localized swelling, mass and lump, head: Secondary | ICD-10-CM | POA: Insufficient documentation

## 2013-02-18 DIAGNOSIS — K1370 Unspecified lesions of oral mucosa: Secondary | ICD-10-CM | POA: Insufficient documentation

## 2013-02-18 DIAGNOSIS — K0889 Other specified disorders of teeth and supporting structures: Secondary | ICD-10-CM

## 2013-02-18 DIAGNOSIS — J45909 Unspecified asthma, uncomplicated: Secondary | ICD-10-CM | POA: Insufficient documentation

## 2013-02-18 MED ORDER — HYDROCODONE-ACETAMINOPHEN 5-325 MG PO TABS: 1.0000 | ORAL_TABLET | Freq: Four times a day (QID) | ORAL | Status: DC | PRN

## 2013-02-18 MED ORDER — PENICILLIN V POTASSIUM 500 MG PO TABS: 500.0000 mg | ORAL_TABLET | Freq: Four times a day (QID) | ORAL | Status: AC

## 2013-02-18 NOTE — ED Notes (Signed)
Patient states that she needs extensive dental work to include extraction, root canal, fillings, etc and that her dentist can't do it all.  She advised that her dentist referred her to oral surgeon but they don't accept her insurance.   Patient claims she cannot pin point which tooth hurts, but that her top and bottom teeth hurt on the right side.

## 2013-02-18 NOTE — ED Provider Notes (Signed)
History     CSN: [id number]  Arrival date & time 02/18/13  0927   First MD Initiated Contact with Patient 02/18/13 [phone]      Chief Complaint  Patient presents with  . Dental Pain    (Consider location/radiation/quality/duration/timing/severity/associated sxs/prior treatment) HPI Comments: Patient presents with a chief complaint of dental pain of the right upper and lower posterior teeth.  She reports that the pain has been present intermittently for months, but became worse last evening.  Pain has been constant since last evening.  She denies any dental injury or trauma.  She reports that she has been seen by a Dentist and has been told that she needs to see an Transport planner.  She reports that she was unable to see the Oral Surgeon that she was referred to because her insurance was not accepted by the Oral Surgeon.  She has not taken anything for pain prior to arrival.  Patient is a 27 y.o. female presenting with tooth pain.  Dental PainThe primary symptoms include mouth pain. Primary symptoms do not include dental injury, oral bleeding or fever.  Additional symptoms include: dental sensitivity to temperature, gum swelling and gum tenderness. Additional symptoms do not include: purulent gums, trismus, facial swelling, trouble swallowing and pain with swallowing.    Past Medical History  Diagnosis Date  . Abnormal Pap smear     3-4 years ago  . Asthma     History reviewed. No pertinent past surgical history.  Family History  Problem Relation Age of Onset  . Diabetes Maternal Grandmother   . Hypertension Mother     History  Substance Use Topics  . Smoking status: Former Smoker -- 0.10 packs/day    Types: Cigars    Quit date: 08/07/2011  . Smokeless tobacco: Never Used  . Alcohol Use: 1.5 oz/week    3 drink(s) per week    OB History   Grav Para Term Preterm Abortions TAB SAB Ect Mult Living   0               Review of Systems  Constitutional: Negative for fever and  chills.  HENT: Positive for dental problem. Negative for facial swelling, trouble swallowing, neck pain and neck stiffness.   Gastrointestinal: Negative for nausea and vomiting.  All other systems reviewed and are negative.    Allergies  Amoxicillin and Sulfamethoxazole w-trimethoprim  Home Medications   Current Outpatient Rx  Name  Route  Sig  Dispense  Refill  . albuterol (PROVENTIL HFA;VENTOLIN HFA) 108 (90 BASE) MCG/ACT inhaler   Inhalation   Inhale 2 puffs into the lungs every 6 (six) hours as needed. For shortness of breath   1 Inhaler   6   . Cetirizine HCl 10 MG CAPS   Oral   Take 1 capsule (10 mg total) by mouth at bedtime.   30 capsule   6   . Fluticasone-Salmeterol (ADVAIR) 250-50 MCG/DOSE AEPB   Inhalation   Inhale 1 puff into the lungs 2 (two) times daily.   1 each   11   . ibuprofen (ADVIL,MOTRIN) 200 MG tablet   Oral   Take 200 mg by mouth every 6 (six) hours as needed. For fever/pain         . mometasone (NASONEX) 50 MCG/ACT nasal spray   Nasal   Place 2 sprays into the nose daily.   17 g   6   . Polyethyl Glycol-Polyvinyl Alc (ARTIFICIAL TEARS) 1-1 % SOLN   Ophthalmic  Apply 2 drops to eye daily.   1 Bottle   e   . promethazine (PHENERGAN) 12.5 MG tablet   Oral   Take 1 tablet (12.5 mg total) by mouth every 6 (six) hours as needed for nausea.   30 tablet   0   . triamcinolone cream (KENALOG) 0.1 %      Apply to affected area in a thin layer 1-2 times a day for total of 14 days   30 g   2     BP 125/78  Pulse 83  Temp(Src) 97.8 F (36.6 C) (Oral)  Resp 18  SpO2 97%  Physical Exam  Nursing note and vitals reviewed. Constitutional: She is oriented to person, place, and time. She appears well-developed and well-nourished. No distress.  HENT:  Head: Normocephalic and atraumatic. No trismus in the jaw.  Mouth/Throat: Uvula is midline, oropharynx is clear and moist and mucous membranes are normal. Abnormal dentition. No dental  abscesses or edematous. No oropharyngeal exudate, posterior oropharyngeal edema, posterior oropharyngeal erythema or tonsillar abscesses.  Pt able to open and close mouth with out difficulty. Airway intact. Uvula midline. Mild gingival swelling with tenderness over affected area, but no fluctuance. No swelling or tenderness of submental and submandibular regions.  No sublingual tenderness to palpation.  Neck: Normal range of motion and full passive range of motion without pain. Neck supple.  Cardiovascular: Normal rate, regular rhythm and normal heart sounds.   Pulmonary/Chest: Effort normal and breath sounds normal.  Musculoskeletal: Normal range of motion.  Lymphadenopathy:       Head (right side): No submental, no submandibular, no tonsillar, no preauricular and no posterior auricular adenopathy present.       Head (left side): No submental, no submandibular, no tonsillar, no preauricular and no posterior auricular adenopathy present.    She has no cervical adenopathy.  Neurological: She is alert and oriented to person, place, and time.  Skin: Skin is warm and dry. No rash noted. She is not diaphoretic.  Psychiatric: She has a normal mood and affect.    ED Course  Procedures (including critical care time)  Labs Reviewed - No data to display No results found.   No diagnosis found.    MDM  Patient with toothache.  No gross abscess.  Exam unconcerning for Ludwig's angina or spread of infection.  Will treat with penicillin and pain medicine.  Urged patient to follow-up with dentist.          Pascal Lux [place], PA-C 02/19/13 [phone]

## 2013-02-20 NOTE — ED Provider Notes (Signed)
Medical screening examination/treatment/procedure(s) were performed by non-physician practitioner and as supervising physician I was immediately available for consultation/collaboration.   Loren Racer, MD 02/20/13 [phone]

## 2013-08-10 ENCOUNTER — Other Ambulatory Visit (HOSPITAL_COMMUNITY)
Admission: RE | Admit: 2013-08-10 | Discharge: 2013-08-10 | Disposition: A | Discharge status: Home / Self Care | Payer: Managed Care, Other (non HMO) | Source: Ambulatory Visit | Attending: Family Medicine | Admitting: Family Medicine

## 2013-08-10 ENCOUNTER — Encounter (HOSPITAL_COMMUNITY): Payer: Self-pay | Admitting: Emergency Medicine

## 2013-08-10 ENCOUNTER — Emergency Department (INDEPENDENT_AMBULATORY_CARE_PROVIDER_SITE_OTHER)
Admission: EM | Admit: 2013-08-10 | Discharge: 2013-08-10 | Disposition: A | Discharge status: Home / Self Care | Payer: Managed Care, Other (non HMO) | Source: Home / Self Care | Attending: Family Medicine | Admitting: Family Medicine

## 2013-08-10 DIAGNOSIS — B9689 Other specified bacterial agents as the cause of diseases classified elsewhere: Secondary | ICD-10-CM

## 2013-08-10 DIAGNOSIS — Z113 Encounter for screening for infections with a predominantly sexual mode of transmission: Secondary | ICD-10-CM | POA: Insufficient documentation

## 2013-08-10 DIAGNOSIS — N760 Acute vaginitis: Secondary | ICD-10-CM

## 2013-08-10 DIAGNOSIS — B373 Candidiasis of vulva and vagina: Secondary | ICD-10-CM

## 2013-08-10 DIAGNOSIS — A499 Bacterial infection, unspecified: Secondary | ICD-10-CM

## 2013-08-10 LAB — POCT URINALYSIS DIP (DEVICE)
Bilirubin Urine: NEGATIVE
Glucose, UA: NEGATIVE mg/dL
Hgb urine dipstick: NEGATIVE
Nitrite: NEGATIVE
Urobilinogen, UA: 0.2 mg/dL (ref 0.0–1.0)

## 2013-08-10 MED ORDER — FLUCONAZOLE 150 MG PO TABS: 150.0000 mg | ORAL_TABLET | Freq: Once | ORAL | Status: DC

## 2013-08-10 MED ORDER — METRONIDAZOLE 500 MG PO TABS: 500.0000 mg | ORAL_TABLET | Freq: Two times a day (BID) | ORAL | Status: DC

## 2013-08-10 NOTE — ED Provider Notes (Signed)
Medical screening examination/treatment/procedure(s) were performed by a resident physician or non-physician practitioner and as the supervising physician I was immediately available for consultation/collaboration.  Clementeen Graham, MD    Rodolph Bong, MD 08/10/13 [phone]

## 2013-08-10 NOTE — ED Notes (Signed)
Pt  Reports   Symptoms  Of  Vaginal  Discharge         X  1  Week   She  denys  Any    Other  Symptoms   She  Has  No  Pain

## 2013-08-10 NOTE — ED Provider Notes (Signed)
CSN: [id number]     Arrival date & time 08/10/13  1115 History   First MD Initiated Contact with Patient 08/10/13 1247     Chief Complaint  Patient presents with  . Vaginal Discharge   (Consider location/radiation/quality/duration/timing/severity/associated sxs/prior Treatment) Patient is a 27 y.o. female presenting with vaginal discharge. The history is provided by the patient. No language interpreter was used.  Vaginal Discharge Severity:  Moderate Onset quality:  Gradual Timing:  Constant Progression:  Worsening Chronicity:  New Relieved by:  Nothing Ineffective treatments:  None tried   Past Medical History  Diagnosis Date  . Abnormal Pap smear     3-4 years ago  . Asthma    No past surgical history on file. Family History  Problem Relation Age of Onset  . Diabetes Maternal Grandmother   . Hypertension Mother    History  Substance Use Topics  . Smoking status: Former Smoker -- 0.10 packs/day    Types: Cigars    Quit date: 08/07/2011  . Smokeless tobacco: Never Used  . Alcohol Use: 1.5 oz/week    3 drink(s) per week   OB History   Grav Para Term Preterm Abortions TAB SAB Ect Mult Living   0              Review of Systems  Genitourinary: Positive for vaginal discharge.  All other systems reviewed and are negative.    Allergies  Amoxicillin; Sulfamethoxazole-trimethoprim; and Tomato  Home Medications   Current Outpatient Rx  Name  Route  Sig  Dispense  Refill  . albuterol (PROVENTIL HFA;VENTOLIN HFA) 108 (90 BASE) MCG/ACT inhaler   Inhalation   Inhale 2 puffs into the lungs every 6 (six) hours as needed. For shortness of breath   1 Inhaler   6   . Fluticasone-Salmeterol (ADVAIR) 250-50 MCG/DOSE AEPB   Inhalation   Inhale 1 puff into the lungs 2 (two) times daily.   1 each   11   . HYDROcodone-acetaminophen (NORCO/VICODIN) 5-325 MG per tablet   Oral   Take 1-2 tablets by mouth every 6 (six) hours as needed for pain.   15 tablet   0   .  ibuprofen (ADVIL,MOTRIN) 200 MG tablet   Oral   Take 200 mg by mouth every 6 (six) hours as needed. For fever/pain         . EXPIRED: mometasone (NASONEX) 50 MCG/ACT nasal spray   Nasal   Place 2 sprays into the nose daily.   17 g   6   . promethazine (PHENERGAN) 12.5 MG tablet   Oral   Take 1 tablet (12.5 mg total) by mouth every 6 (six) hours as needed for nausea.   30 tablet   0   . triamcinolone cream (KENALOG) 0.1 %      Apply to affected area in a thin layer 1-2 times a day for total of 14 days   30 g   2    LMP 07/30/2013 Physical Exam  Nursing note and vitals reviewed. Constitutional: She is oriented to person, place, and time. She appears well-developed and well-nourished.  HENT:  Head: Normocephalic and atraumatic.  Eyes: Conjunctivae are normal. Pupils are equal, round, and reactive to light.  Neck: Normal range of motion.  Cardiovascular: Normal rate.   Pulmonary/Chest: Effort normal.  Abdominal: Soft.  Genitourinary: Vaginal discharge found.  Musculoskeletal: Normal range of motion.  Neurological: She is alert and oriented to person, place, and time. She has normal reflexes.  Skin: Skin  is warm.  Psychiatric: She has a normal mood and affect.    ED Course  Procedures (including critical care time) Labs Review Labs Reviewed  POCT URINALYSIS DIP (DEVICE) - Abnormal; Notable for the following:    Ketones, ur 15 (*)    Leukocytes, UA TRACE (*)    All other components within normal limits  POCT PREGNANCY, URINE   Imaging Review No results found.  EKG Interpretation     Ventricular Rate:    PR Interval:    QRS Duration:   QT Interval:    QTC Calculation:   R Axis:     Text Interpretation:              MDM   1. BV (bacterial vaginosis)   2. Monilial vulvovaginitis    Rx of flagyl and diflucan    Elson Areas, PA-C 08/10/13 1334

## 2013-08-13 NOTE — ED Notes (Addendum)
Final reports show negative for yeast, gc/chlamydia, positive for trichomonas Treated w metronidazole and diflucan on visit 10-24. Left message for report discussion.. Patient returned call, and after verifying ID, discussed positive and negative findings, advised to complete Rx as prescribed, have partner treated, and to avoid unprotected sex for 10 days until both are treated

## 2013-08-18 NOTE — ED Notes (Signed)
Correction previous lab note: Candida pos. and pt. adequately treated with Diflucan. Vassie Moselle 08/18/2013

## 2013-08-21 ENCOUNTER — Other Ambulatory Visit: Payer: Self-pay

## 2013-09-01 ENCOUNTER — Other Ambulatory Visit: Payer: Self-pay | Admitting: *Deleted

## 2013-09-01 DIAGNOSIS — J45909 Unspecified asthma, uncomplicated: Secondary | ICD-10-CM

## 2013-09-01 MED ORDER — ALBUTEROL SULFATE HFA 108 (90 BASE) MCG/ACT IN AERS: 2.0000 | INHALATION_SPRAY | Freq: Four times a day (QID) | RESPIRATORY_TRACT | Status: DC | PRN

## 2013-09-01 MED ORDER — FLUTICASONE-SALMETEROL 250-50 MCG/DOSE IN AEPB: 1.0000 | INHALATION_SPRAY | Freq: Two times a day (BID) | RESPIRATORY_TRACT | Status: DC

## 2013-09-01 NOTE — Telephone Encounter (Signed)
Needs new rx sent to [facility] on [address].

## 2013-09-26 ENCOUNTER — Other Ambulatory Visit: Payer: Self-pay | Admitting: *Deleted

## 2013-10-01 MED ORDER — TRIAMCINOLONE ACETONIDE 0.1 % EX CREA: TOPICAL_CREAM | CUTANEOUS | Status: DC

## 2013-11-07 ENCOUNTER — Ambulatory Visit (INDEPENDENT_AMBULATORY_CARE_PROVIDER_SITE_OTHER): Payer: Managed Care, Other (non HMO) | Admitting: Internal Medicine

## 2013-11-07 ENCOUNTER — Encounter: Payer: Self-pay | Admitting: Internal Medicine

## 2013-11-07 VITALS — BP 111/68 | HR 75 | Temp 98.7°F | Ht 62.0 in | Wt 128.9 lb

## 2013-11-07 DIAGNOSIS — Z0000 Encounter for general adult medical examination without abnormal findings: Secondary | ICD-10-CM

## 2013-11-07 DIAGNOSIS — R590 Localized enlarged lymph nodes: Secondary | ICD-10-CM

## 2013-11-07 DIAGNOSIS — N898 Other specified noninflammatory disorders of vagina: Secondary | ICD-10-CM

## 2013-11-07 DIAGNOSIS — N946 Dysmenorrhea, unspecified: Secondary | ICD-10-CM

## 2013-11-07 DIAGNOSIS — R1909 Other intra-abdominal and pelvic swelling, mass and lump: Secondary | ICD-10-CM

## 2013-11-07 DIAGNOSIS — R229 Localized swelling, mass and lump, unspecified: Secondary | ICD-10-CM

## 2013-11-07 DIAGNOSIS — R1903 Right lower quadrant abdominal swelling, mass and lump: Secondary | ICD-10-CM

## 2013-11-07 LAB — CBC WITH DIFFERENTIAL/PLATELET
Basophils Absolute: 0.1 10*3/uL (ref 0.0–0.1)
Basophils Relative: 1 % (ref 0–1)
Eosinophils Absolute: 0.3 10*3/uL (ref 0.0–0.7)
Eosinophils Relative: 4 % (ref 0–5)
HCT: 39 % (ref 36.0–46.0)
HEMOGLOBIN: 13 g/dL (ref 12.0–15.0)
LYMPHS ABS: 3.2 10*3/uL (ref 0.7–4.0)
LYMPHS PCT: 42 % (ref 12–46)
MCH: 29.3 pg (ref 26.0–34.0)
MCHC: 33.3 g/dL (ref 30.0–36.0)
MCV: 87.8 fL (ref 78.0–100.0)
Monocytes Absolute: 0.7 10*3/uL (ref 0.1–1.0)
Monocytes Relative: 10 % (ref 3–12)
NEUTROS PCT: 43 % (ref 43–77)
Neutro Abs: 3.3 10*3/uL (ref 1.7–7.7)
Platelets: 334 10*3/uL (ref 150–400)
RBC: 4.44 MIL/uL (ref 3.87–5.11)
RDW: 13.8 % (ref 11.5–15.5)
WBC: 7.5 10*3/uL (ref 4.0–10.5)

## 2013-11-07 NOTE — Progress Notes (Signed)
   Subjective:    Patient ID: Sandra Oneill, female    DOB: Feb 13, 1986, 28 y.o.   MRN: [id number]  HPI Comments: Sandra Oneill is a 28 year old woman with asthma who presents with complaint of a mass on the left side of her groin for the past 3 months.  She describes it as soft and mobile, about an inch in diameter.  It is painful.  It will stay a few days then disappear.  She has never noticed anything like this before.  It seems to pop up after shaving her bikini line.  She has chronic vaginal itching which she has been evaluated by her OBGyn for but not prescribed any medication.  She reports thick, brown vaginal discharge for the past few months which she has not discussed with OBGyn.  The discharge does not seem associated with menses and is occuring daily.  She denies vaginal lesions, ulcers, abnormal uterine bleeding or dysuria.  Periods are regular and FDLMP was 12/26 and she denies chance of pregnancy.      She also reports painful BMs once monthly which coincide with her period.  Passing gas is also painful during menses.  She stopped taking Depo injection last year and notes increasingly painful periods since that time.  She denies increased flow.  She takes percocet for pain (she gets from a family member).  She feels over the counter medications have not helped.  Period pain improves by day 2.  Two aunts and a cousin had hysterectomies because of their periods but she is unsure if they had fibroids or cysts.  She has no personal history of fibroids or ovarian cyst.     Her asthma is well controlled.  She denies dyspnea and has not had to increase rescue inhaler use.      Review of Systems  Constitutional: Positive for fatigue. Negative for fever, chills and appetite change.  Respiratory: Negative for shortness of breath.   Cardiovascular: Negative for chest pain.  Gastrointestinal: Positive for constipation. Negative for nausea, vomiting, abdominal pain, diarrhea and blood in stool.    Genitourinary: Positive for vaginal discharge. Negative for dysuria, vaginal bleeding and vaginal pain.  Neurological: Negative for headaches.       Objective:   Physical Exam  Vitals reviewed. Constitutional: She is oriented to person, place, and time. She appears well-developed. No distress.  HENT:  Mouth/Throat: Oropharynx is clear and moist. No oropharyngeal exudate.  Eyes: Pupils are equal, round, and reactive to light.  Cardiovascular: Normal rate, regular rhythm and normal heart sounds.   Pulmonary/Chest: Effort normal and breath sounds normal. No respiratory distress. She has no wheezes. She has no rales.  Abdominal: Soft. Bowel sounds are normal. She exhibits no distension. There is no tenderness.  Genitourinary:  No inguinal lymphadenopathy or mass appreciated.  Neurological: She is alert and oriented to person, place, and time.  Skin: Skin is warm. She is not diaphoretic.  Psychiatric: She has a normal mood and affect. Her behavior is normal.          Assessment & Plan:  Please see problem based assessment and plan.

## 2013-11-07 NOTE — Patient Instructions (Signed)
1. Please take an NSAID (like Ibuprofen or Aleve) for your painful periods.    2. Make an appointment to see your OBGyn as soon as possible for evaluation of your pain and discharge.  3. If the swelling in your groin returns and does not go away please call clinic.  You will be contacted if there are lab abnormalities.   4. If you have worsening of your symptoms or new symptoms arise, please call the clinic [phone]), or go to the ER immediately if symptoms are severe.  Lymphadenopathy Lymphadenopathy means "disease of the lymph glands." But the term is usually used to describe swollen or enlarged lymph glands, also called lymph nodes. These are the bean-shaped organs found in many locations including the neck, underarm, and groin. Lymph glands are part of the immune system, which fights infections in your body. Lymphadenopathy can occur in just one area of the body, such as the neck, or it can be generalized, with lymph node enlargement in several areas. The nodes found in the neck are the most common sites of lymphadenopathy. CAUSES  When your immune system responds to germs (such as viruses or bacteria ), infection-fighting cells and fluid build up. This causes the glands to grow in size. This is usually not something to worry about. Sometimes, the glands themselves can become infected and inflamed. This is called lymphadenitis. Enlarged lymph nodes can be caused by many diseases:  Bacterial disease, such as strep throat or a skin infection.  Viral disease, such as a common cold.  Other germs, such as lyme disease, tuberculosis, or sexually transmitted diseases.  Cancers, such as lymphoma (cancer of the lymphatic system) or leukemia (cancer of the white blood cells).  Inflammatory diseases such as lupus or rheumatoid arthritis.  Reactions to medications. Many of the diseases above are rare, but important. This is why you should see your caregiver if you have lymphadenopathy. SYMPTOMS    Swollen, enlarged lumps in the neck, back of the head or other locations.  Tenderness.  Warmth or redness of the skin over the lymph nodes.  Fever. DIAGNOSIS  Enlarged lymph nodes are often near the source of infection. They can help healthcare providers diagnose your illness. For instance:   Swollen lymph nodes around the jaw might be caused by an infection in the mouth.  Enlarged glands in the neck often signal a throat infection.  Lymph nodes that are swollen in more than one area often indicate an illness caused by a virus. Your caregiver most likely will know what is causing your lymphadenopathy after listening to your history and examining you. Blood tests, x-rays or other tests may be needed. If the cause of the enlarged lymph node cannot be found, and it does not go away by itself, then a biopsy may be needed. Your caregiver will discuss this with you. TREATMENT  Treatment for your enlarged lymph nodes will depend on the cause. Many times the nodes will shrink to normal size by themselves, with no treatment. Antibiotics or other medicines may be needed for infection. Only take over-the-counter or prescription medicines for pain, discomfort or fever as directed by your caregiver. HOME CARE INSTRUCTIONS  Swollen lymph glands usually return to normal when the underlying medical condition goes away. If they persist, contact your health-care provider. He/she might prescribe antibiotics or other treatments, depending on the diagnosis. Take any medications exactly as prescribed. Keep any follow-up appointments made to check on the condition of your enlarged nodes.  SEEK MEDICAL CARE  IF:   Swelling lasts for more than two weeks.  You have symptoms such as weight loss, night sweats, fatigue or fever that does not go away.  The lymph nodes are hard, seem fixed to the skin or are growing rapidly.  Skin over the lymph nodes is red and inflamed. This could mean there is an infection. SEEK  IMMEDIATE MEDICAL CARE IF:   Fluid starts leaking from the area of the enlarged lymph node.  You develop a fever of 102 F (38.9 C) or greater.  Severe pain develops (not necessarily at the site of a large lymph node).  You develop chest pain or shortness of breath.  You develop worsening abdominal pain. MAKE SURE YOU:   Understand these instructions.  Will watch your condition.  Will get help right away if you are not doing well or get worse. Document Released: 07/11/2008 Document Revised: 12/25/2011 Document Reviewed: 07/11/2008 [facility] Patient Information 2014 [place], [place].

## 2013-11-07 NOTE — Assessment & Plan Note (Addendum)
Likely a lymph node given the description and location.  Solitary, small, soft, mobile and tender which is less concerning for malignancy.  Infection/inflammation is possible.  No signs of lymphadenopathy or swelling today.  Sandra Oneill has no systemic signs but is having local vaginal discharge and increased menstrual pain.  Last PAP ( 1 year ago) was negative for malignancy.   Prior HIV, GC/CT negative.  Plan:  1) HIV (patient requested), GC/CT, CBC            2) Return to clinic if mass returns            3) Refer to OBGyn for evaluation vaginal discharge and increasing menstrual pain.

## 2013-11-07 NOTE — Assessment & Plan Note (Addendum)
She describes brownish discharge present for the past few months not associated with menstrual bleeding.  Also with increasingly painful menses and painful BMs during menses since stopping Depo last year.  Last PAP normal.  [facility]

## 2013-11-08 LAB — HIV ANTIBODY (ROUTINE TESTING W REFLEX): HIV: NONREACTIVE

## 2013-11-08 NOTE — Progress Notes (Signed)
Case discussed with Dr. Wilson soon after the resident saw the patient. We reviewed the resident's history and exam and pertinent patient test results. I agree with the assessment, diagnosis, and plan of care documented in the resident's note. 

## 2013-11-10 ENCOUNTER — Other Ambulatory Visit: Payer: Managed Care, Other (non HMO)

## 2013-11-10 ENCOUNTER — Telehealth: Payer: Self-pay | Admitting: Internal Medicine

## 2013-11-10 DIAGNOSIS — R590 Localized enlarged lymph nodes: Secondary | ICD-10-CM

## 2013-11-10 DIAGNOSIS — N898 Other specified noninflammatory disorders of vagina: Secondary | ICD-10-CM

## 2013-11-10 NOTE — Telephone Encounter (Signed)
Spoke with Ms. Sandra Oneill and asked that she come in to provide a urine sample because I failed to get one when she was in clinic on Friday.  She agrees to come in this week but is not sure which day as it will be based on how early she gets finished with work.

## 2013-11-10 NOTE — Addendum Note (Signed)
Addended by: Bufford SpikesFULCHER, Friend Dorfman N on: 11/10/2013 05:06 PM   Modules accepted: Orders

## 2013-11-11 LAB — URINALYSIS, ROUTINE W REFLEX MICROSCOPIC
Bilirubin Urine: NEGATIVE
Glucose, UA: NEGATIVE mg/dL
Hgb urine dipstick: NEGATIVE
Ketones, ur: NEGATIVE mg/dL
NITRITE: NEGATIVE
PH: 6.5 (ref 5.0–8.0)
Protein, ur: NEGATIVE mg/dL
SPECIFIC GRAVITY, URINE: 1.016 (ref 1.005–1.030)
Urobilinogen, UA: 0.2 mg/dL (ref 0.0–1.0)

## 2013-11-11 LAB — URINALYSIS, MICROSCOPIC ONLY
CASTS: NONE SEEN
CRYSTALS: NONE SEEN

## 2013-12-08 ENCOUNTER — Ambulatory Visit: Payer: Managed Care, Other (non HMO) | Admitting: Obstetrics & Gynecology

## 2013-12-31 ENCOUNTER — Encounter: Payer: Self-pay | Admitting: Obstetrics & Gynecology

## 2013-12-31 ENCOUNTER — Telehealth: Payer: Self-pay | Admitting: *Deleted

## 2013-12-31 ENCOUNTER — Ambulatory Visit: Payer: Managed Care, Other (non HMO) | Admitting: Obstetrics & Gynecology

## 2013-12-31 DIAGNOSIS — N926 Irregular menstruation, unspecified: Secondary | ICD-10-CM

## 2013-12-31 DIAGNOSIS — N920 Excessive and frequent menstruation with regular cycle: Secondary | ICD-10-CM

## 2013-12-31 NOTE — Telephone Encounter (Signed)
Pt called with questions as to why her appt for today was cancelled.  I spoke w/pt and stated that when the doctor reviewed her records, there was no history of fibroid tumors so it did not appear that she needed to be seen. Pt then stated that she has been having irregular periods and also heavy periods for 1 year. Her PCP told her that it could be from fibroids. I advised pt that while that is true, there are also other reasons for irregular menstrual activity. I consulted with Dr. Erin FullingHarraway-Smith and she recommended that pt have pelvic and transvag US, then have evaluation in clinic.  Pt was agreeable to this plan.  US scheduled for 4/13 @ 1400, then clinic appt to follow @ 1515.

## 2014-01-26 ENCOUNTER — Ambulatory Visit (HOSPITAL_COMMUNITY): Payer: Managed Care, Other (non HMO)

## 2014-01-26 ENCOUNTER — Ambulatory Visit: Payer: Managed Care, Other (non HMO) | Admitting: Obstetrics & Gynecology

## 2014-02-02 ENCOUNTER — Ambulatory Visit (HOSPITAL_COMMUNITY)
Admission: RE | Admit: 2014-02-02 | Discharge: 2014-02-02 | Disposition: A | Discharge status: Home / Self Care | Payer: Managed Care, Other (non HMO) | Source: Ambulatory Visit | Attending: Obstetrics & Gynecology | Admitting: Obstetrics & Gynecology

## 2014-02-02 ENCOUNTER — Ambulatory Visit (INDEPENDENT_AMBULATORY_CARE_PROVIDER_SITE_OTHER): Payer: Managed Care, Other (non HMO) | Admitting: Obstetrics & Gynecology

## 2014-02-02 ENCOUNTER — Encounter: Payer: Self-pay | Admitting: Obstetrics & Gynecology

## 2014-02-02 VITALS — BP 119/81 | HR 86 | Temp 98.5°F | Ht 62.0 in | Wt 130.8 lb

## 2014-02-02 DIAGNOSIS — Z01812 Encounter for preprocedural laboratory examination: Secondary | ICD-10-CM

## 2014-02-02 DIAGNOSIS — N938 Other specified abnormal uterine and vaginal bleeding: Secondary | ICD-10-CM | POA: Insufficient documentation

## 2014-02-02 DIAGNOSIS — N926 Irregular menstruation, unspecified: Secondary | ICD-10-CM | POA: Insufficient documentation

## 2014-02-02 DIAGNOSIS — Z309 Encounter for contraceptive management, unspecified: Secondary | ICD-10-CM

## 2014-02-02 DIAGNOSIS — N946 Dysmenorrhea, unspecified: Secondary | ICD-10-CM

## 2014-02-02 DIAGNOSIS — N949 Unspecified condition associated with female genital organs and menstrual cycle: Secondary | ICD-10-CM | POA: Insufficient documentation

## 2014-02-02 DIAGNOSIS — N920 Excessive and frequent menstruation with regular cycle: Secondary | ICD-10-CM | POA: Insufficient documentation

## 2014-02-02 LAB — POCT PREGNANCY, URINE: Preg Test, Ur: NEGATIVE

## 2014-02-02 MED ORDER — NAPROXEN SODIUM 550 MG PO TABS: 550.0000 mg | ORAL_TABLET | Freq: Two times a day (BID) | ORAL | Status: DC | PRN

## 2014-02-02 MED ORDER — MEDROXYPROGESTERONE ACETATE 104 MG/0.65ML ~~LOC~~ SUSP
104.0000 mg | Freq: Once | SUBCUTANEOUS | Status: AC
  Administered 2014-02-02: 104 mg via SUBCUTANEOUS

## 2014-02-03 NOTE — Progress Notes (Signed)
   CLINIC ENCOUNTER NOTE  History:  28 y.o. G0P0 here today for management of long term dysmenorrhea in the setting of regular periods.  Periods are heavy for the first few days but are associated with significant pain.  Wants to discuss management options. Has been on Depo Provera in the past, liked not having frequent periods.  No signs/symptoms of anemia.  Desires future pregnancies.   The following portions of the patient's history were reviewed and updated as appropriate: allergies, current medications, past family history, past medical history, past social history, past surgical history and problem list.  Normal pap in 12/06/12.  Review of Systems:  A comprehensive review of systems was negative.  Objective:  Physical Exam BP 119/81  Pulse 86  Temp(Src) 98.5 F (36.9 C)  Ht 5\' 2"  (1.575 m)  Wt 130 lb 12.8 oz (59.33 kg)  BMI 23.92 kg/m2  LMP 01/13/2014 Gen: NAD Abd: Soft, nontender and nondistended Pelvic: Deferred  Labs and Imaging 02/02/2014   TRANSABDOMINAL AND TRANSVAGINAL ULTRASOUND OF PELVIS CLINICAL DATA:  Dysfunctional uterine bleeding.  Menorrhagia.   TECHNIQUE: Both transabdominal and transvaginal ultrasound examinations of the pelvis were performed. Transabdominal technique was performed for global imaging of the pelvis including uterus, ovaries, adnexal regions, and pelvic cul-de-sac. It was necessary to proceed with endovaginal exam following the transabdominal exam to visualize the endometrium.  COMPARISON:  03/05/2006  FINDINGS: Uterus  Measurements: 7.0 x 3.1 x 4.6 cm. No fibroids or other mass visualized.  Endometrium  Thickness: 2 mm.  No focal abnormality visualized.  Right ovary  Measurements: 3.8 x 2.0 x 2.6 cm. Normal appearance/no adnexal mass. Small follicles.  Left ovary  Measurements: 4.1 x 1.6 x 2.5 cm. Normal appearance/no adnexal mass. Small follicles.  Other findings  No free fluid.  IMPRESSION: Unremarkable pelvic ultrasound. If bleeding remains  unresponsive to hormonal or medical therapy, sonohysterogram should be considered for focal lesion work-up. (Ref: Radiological Reasoning: Algorithmic Workup of Abnormal Vaginal Bleeding with Endovaginal Sonography and Sonohysterography. AJR 2008; 161:W96-04; 191:S68-73)   Electronically Signed   By: Charlett NoseKevin  Dover M.D.   On: 02/02/2014 13:33    Assessment & Plan:  Discussed management options for dysmenorrhea including NSAIDs and other options for menstrual suppression including OCPs, oral progesterone, Depo Provera, Mirena IUD.  Discussed risks and benefits of each method.  Patient desires Depo Provera as she has tolerated this in the past, this was given to her (no unprotected IC recently, negative UPT today). She was also given a prescription for Naproxen as needed.  Bleeding precautions reviewed. Return in 3 months for repeat injection.   Jaynie CollinsUGONNA  Thierry Dobosz, MD, FACOG Attending Obstetrician & Gynecologist Faculty Practice[facility]

## 2014-02-03 NOTE — Patient Instructions (Signed)
Return to clinic for any scheduled appointments or for any gynecologic concerns as needed.   

## 2014-04-26 ENCOUNTER — Emergency Department (INDEPENDENT_AMBULATORY_CARE_PROVIDER_SITE_OTHER)
Admission: EM | Admit: 2014-04-26 | Discharge: 2014-04-26 | Disposition: A | Discharge status: Home / Self Care | Payer: Managed Care, Other (non HMO) | Source: Home / Self Care | Attending: Family Medicine | Admitting: Family Medicine

## 2014-04-26 ENCOUNTER — Encounter (HOSPITAL_COMMUNITY): Payer: Self-pay | Admitting: Emergency Medicine

## 2014-04-26 DIAGNOSIS — G44219 Episodic tension-type headache, not intractable: Secondary | ICD-10-CM

## 2014-04-26 LAB — POCT PREGNANCY, URINE: Preg Test, Ur: NEGATIVE

## 2014-04-26 MED ORDER — AMITRIPTYLINE HCL 50 MG PO TABS: 50.0000 mg | ORAL_TABLET | Freq: Every day | ORAL | Status: DC

## 2014-04-26 MED ORDER — ONDANSETRON 4 MG PO TBDP
4.0000 mg | ORAL_TABLET | Freq: Once | ORAL | Status: AC
  Administered 2014-04-26: 4 mg via ORAL

## 2014-04-26 MED ORDER — ONDANSETRON 4 MG PO TBDP
ORAL_TABLET | ORAL | Status: AC
  Filled 2014-04-26: qty 1

## 2014-04-26 NOTE — ED Notes (Signed)
C/O hx HAs in past, but hasn't had "for a long time".  Had few episodes vomiting, without any other sxs, on Thurs & Fri; last night started with HA.  States HA does feel like her past HAs, but Advil not helping.

## 2014-04-26 NOTE — ED Provider Notes (Addendum)
CSN: [id number]     Arrival date & time 04/26/14  1422 History   None    Chief Complaint  Patient presents with  . Headache   (Consider location/radiation/quality/duration/timing/severity/associated sxs/prior Treatment) Patient is a 28 y.o. female presenting with headaches. The history is provided by the patient.  Headache Pain location:  Frontal Quality:  Dull Radiates to:  Does not radiate Duration:  1 day Progression:  Unchanged Chronicity:  New Similar to prior headaches: yes   Context: emotional stress   Relieved by:  None tried Worsened by:  Nothing tried Ineffective treatments:  None tried Associated symptoms: nausea and vomiting   Associated symptoms: no abdominal pain, no back pain, no blurred vision, no diarrhea, no dizziness, no fever, no loss of balance, no neck pain, no neck stiffness, no sore throat and no weakness   Associated symptoms comment:  Admits to tension, stress and not eating or sleeping v well.   Past Medical History  Diagnosis Date  . Abnormal Pap smear     3-4 years ago  . Asthma    History reviewed. No pertinent past surgical history. Family History  Problem Relation Age of Onset  . Diabetes Maternal Grandmother   . Hypertension Mother    History  Substance Use Topics  . Smoking status: Former Smoker -- 0.10 packs/day    Types: Cigars    Quit date: 08/07/2011  . Smokeless tobacco: Never Used  . Alcohol Use: Yes     Comment: occasional   OB History   Grav Para Term Preterm Abortions TAB SAB Ect Mult Living   0              Review of Systems  Constitutional: Negative.  Negative for fever.  HENT: Negative for sore throat.   Eyes: Negative for blurred vision.  Gastrointestinal: Positive for nausea and vomiting. Negative for abdominal pain, diarrhea and constipation.  Genitourinary: Negative.  Negative for menstrual problem.  Musculoskeletal: Negative for back pain, gait problem, neck pain and neck stiffness.  Skin: Negative.    Neurological: Positive for headaches. Negative for dizziness, weakness, light-headedness and loss of balance.    Allergies  Amoxicillin; Sulfamethoxazole-trimethoprim; and Tomato  Home Medications   Prior to Admission medications   Medication Sig Start Date End Date Taking? Authorizing Provider  albuterol (PROVENTIL HFA;VENTOLIN HFA) 108 (90 BASE) MCG/ACT inhaler Inhale 2 puffs into the lungs every 6 (six) hours as needed. For shortness of breath 09/01/13  Yes Genelle Gather, MD  Fluticasone-Salmeterol (ADVAIR) 250-50 MCG/DOSE AEPB Inhale 1 puff into the lungs 2 (two) times daily. 09/01/13  Yes Genelle Gather, MD  medroxyPROGESTERone (DEPO-PROVERA) 150 MG/ML injection Inject 150 mg into the muscle every 3 (three) months.   Yes Historical Provider, MD  amitriptyline (ELAVIL) 50 MG tablet Take 1 tablet (50 mg total) by mouth at bedtime. 04/26/14   Linna Hoff, MD  mometasone (NASONEX) 50 MCG/ACT nasal spray Place 2 sprays into the nose daily. 07/04/12 07/04/13  Genelle Gather, MD  naproxen sodium (ANAPROX) 550 MG tablet Take 1 tablet (550 mg total) by mouth 2 (two) times daily as needed for moderate pain. Take with food 02/02/14   Tereso Newcomer, MD  promethazine (PHENERGAN) 12.5 MG tablet Take 1 tablet (12.5 mg total) by mouth every 6 (six) hours as needed for nausea. 11/27/12   Elyse Jarvis, MD  triamcinolone cream (KENALOG) 0.1 % Apply to affected area in a thin layer 1-2 times a day for total of 14  days 09/26/13   Genelle GatherKathryn F Glenn, MD   BP 134/82  Pulse 79  Temp(Src) 98.1 F (36.7 C) (Oral)  Resp 16  SpO2 100% Physical Exam  Nursing note and vitals reviewed. Constitutional: She is oriented to person, place, and time. She appears well-developed and well-nourished. No distress.  HENT:  Head: Normocephalic.  Right Ear: External ear normal.  Left Ear: External ear normal.  Mouth/Throat: Oropharynx is clear and moist.  Eyes: EOM are normal. Pupils are equal, round, and reactive to  light.  Neck: Normal range of motion. Neck supple.  Cardiovascular: Normal heart sounds.   Pulmonary/Chest: Breath sounds normal.  Abdominal: Soft. Bowel sounds are normal.  Lymphadenopathy:    She has no cervical adenopathy.  Neurological: She is alert and oriented to person, place, and time.  Skin: Skin is warm and dry.    ED Course  Procedures (including critical care time) Labs Review Labs Reviewed  POCT PREGNANCY, URINE    Imaging Review No results found.   MDM   1. Episodic tension-type headache, not intractable        Linna HoffJames D Antoria Lanza, MD 04/26/14 1535  Linna HoffJames D Montie Swiderski, MD 04/26/14 [phone]

## 2014-04-26 NOTE — ED Notes (Signed)
Gatorade given. 

## 2014-04-27 ENCOUNTER — Ambulatory Visit (INDEPENDENT_AMBULATORY_CARE_PROVIDER_SITE_OTHER): Payer: Managed Care, Other (non HMO)

## 2014-04-27 VITALS — BP 122/79 | HR 90 | Wt 135.4 lb

## 2014-04-27 DIAGNOSIS — Z3049 Encounter for surveillance of other contraceptives: Secondary | ICD-10-CM

## 2014-04-27 MED ORDER — MEDROXYPROGESTERONE ACETATE 104 MG/0.65ML ~~LOC~~ SUSP
104.0000 mg | Freq: Once | SUBCUTANEOUS | Status: AC
Start: 2014-04-27 — End: 2014-04-27
  Administered 2014-04-27: 104 mg via SUBCUTANEOUS

## 2014-04-27 NOTE — Progress Notes (Signed)
Reviewed

## 2014-05-01 ENCOUNTER — Emergency Department (HOSPITAL_COMMUNITY)
Admission: EM | Admit: 2014-05-01 | Discharge: 2014-05-01 | Disposition: A | Discharge status: Home / Self Care | Payer: Managed Care, Other (non HMO) | Attending: Emergency Medicine | Admitting: Emergency Medicine

## 2014-05-01 ENCOUNTER — Encounter (HOSPITAL_COMMUNITY): Payer: Self-pay | Admitting: Emergency Medicine

## 2014-05-01 DIAGNOSIS — Z87891 Personal history of nicotine dependence: Secondary | ICD-10-CM | POA: Insufficient documentation

## 2014-05-01 DIAGNOSIS — J45909 Unspecified asthma, uncomplicated: Secondary | ICD-10-CM | POA: Insufficient documentation

## 2014-05-01 DIAGNOSIS — Z880 Allergy status to penicillin: Secondary | ICD-10-CM | POA: Insufficient documentation

## 2014-05-01 DIAGNOSIS — Z79899 Other long term (current) drug therapy: Secondary | ICD-10-CM | POA: Insufficient documentation

## 2014-05-01 DIAGNOSIS — IMO0002 Reserved for concepts with insufficient information to code with codable children: Secondary | ICD-10-CM | POA: Insufficient documentation

## 2014-05-01 DIAGNOSIS — L509 Urticaria, unspecified: Secondary | ICD-10-CM

## 2014-05-01 MED ORDER — PREDNISONE 10 MG PO TABS: 20.0000 mg | ORAL_TABLET | Freq: Every day | ORAL | Status: DC

## 2014-05-01 MED ORDER — PREDNISONE 20 MG PO TABS
60.0000 mg | ORAL_TABLET | Freq: Once | ORAL | Status: AC
  Administered 2014-05-01: 60 mg via ORAL
  Filled 2014-05-01: qty 3

## 2014-05-01 NOTE — ED Notes (Signed)
Pt states she developed hives last night after eating a [employer] burger. Pt states she is highly allergic to tomatoes and asked for no tomatoes on her burger. Pt states she has been taking Benadryl and feels better, but then the rash comes back. Pt states rash is itchy. Last Benadryl at 1330. Pt alert, no acute distress. Speaks in complete sentences.

## 2014-05-01 NOTE — Discharge Instructions (Signed)
Hives Hives are itchy, red, swollen areas of the skin. They can vary in size and location on your body. Hives can come and go for hours or several days (acute hives) or for several weeks (chronic hives). Hives do not spread from person to person (noncontagious). They may get worse with scratching, exercise, and emotional stress. CAUSES   Allergic reaction to food, additives, or drugs.  Infections, including the common cold.  Illness, such as vasculitis, lupus, or thyroid disease.  Exposure to sunlight, heat, or cold.  Exercise.  Stress.  Contact with chemicals. SYMPTOMS   Red or white swollen patches on the skin. The patches may change size, shape, and location quickly and repeatedly.  Itching.  Swelling of the hands, feet, and face. This may occur if hives develop deeper in the skin. DIAGNOSIS  Your caregiver can usually tell what is wrong by performing a physical exam. Skin or blood tests may also be done to determine the cause of your hives. In some cases, the cause cannot be determined. TREATMENT  Mild cases usually get better with medicines such as antihistamines. Severe cases may require an emergency epinephrine injection. If the cause of your hives is known, treatment includes avoiding that trigger.  HOME CARE INSTRUCTIONS   Avoid causes that trigger your hives.  Take antihistamines as directed by your caregiver to reduce the severity of your hives. Non-sedating or low-sedating antihistamines are usually recommended. Do not drive while taking an antihistamine.  Take any other medicines prescribed for itching as directed by your caregiver.  Wear loose-fitting clothing.  Keep all follow-up appointments as directed by your caregiver. SEEK MEDICAL CARE IF:   You have persistent or severe itching that is not relieved with medicine.  You have painful or swollen joints. SEEK IMMEDIATE MEDICAL CARE IF:   You have a fever.  Your tongue or lips are swollen.  You have  trouble breathing or swallowing.  You feel tightness in the throat or chest.  You have abdominal pain. These problems may be the first sign of a life-threatening allergic reaction. Call your local emergency services (911 in [place] MAKE SURE YOU:   Understand these instructions.  Will watch your condition.  Will get help right away if you are not doing well or get worse. Document Released: 10/02/2005 Document Revised: 10/07/2013 Document Reviewed: 12/26/2011 [employer] Patient Information 2015 [employer]. This information is not intended to replace advice given to you by your health care provider. Make sure you discuss any questions you have with your health care provider.  

## 2014-05-01 NOTE — ED Provider Notes (Signed)
CSN: [id number]     Arrival date & time 05/01/14  2058 History  This chart was scribed for non-physician practitioner Marlon Pel working with No att. providers found by Carl Best, ED Scribe. This patient was seen in room WTR9/WTR9 and the patient's care was started at 9:32 PM.      Chief Complaint  Patient presents with  . Urticaria   Patient is a 28 y.o. female presenting with urticaria. The history is provided by the patient. No language interpreter was used.  Urticaria Pertinent negatives include no shortness of breath.   HPI Comments: Sandra Oneill is a 28 y.o. female with a history of an allergy to tomatoes who presents to the Emergency Department complaining of an itchy rash located on her legs and arms bilaterally and abdomen that started yesterday after the patient ate a hamburger from [facility].  The patient states that there were no tomatoes on her burger that she saw but they may have put it on and removed it.  She states that she has taken Benadryl with temporary relief to her symptoms.  The patient denies SOB, trouble breathing, trouble swallowing, lip swelling, tongue swelling, and drooling as associated symptoms.  She denies taking Prednisone in the past.     Past Medical History  Diagnosis Date  . Abnormal Pap smear     3-4 years ago  . Asthma    History reviewed. No pertinent past surgical history. Family History  Problem Relation Age of Onset  . Diabetes Maternal Grandmother   . Hypertension Mother    History  Substance Use Topics  . Smoking status: Former Smoker -- 0.10 packs/day    Types: Cigars    Quit date: 08/07/2011  . Smokeless tobacco: Never Used  . Alcohol Use: Yes     Comment: occasional   OB History   Grav Para Term Preterm Abortions TAB SAB Ect Mult Living   0              Review of Systems  HENT: Negative for drooling, facial swelling and trouble swallowing.   Respiratory: Negative for cough, chest tightness and shortness of breath.    Skin: Positive for rash. Negative for color change, pallor and wound.  All other systems reviewed and are negative.     Allergies  Amoxicillin; Sulfamethoxazole-trimethoprim; and Tomato  Home Medications   Prior to Admission medications   Medication Sig Start Date End Date Taking? Authorizing Provider  albuterol (PROVENTIL HFA;VENTOLIN HFA) 108 (90 BASE) MCG/ACT inhaler Inhale 2 puffs into the lungs every 6 (six) hours as needed. For shortness of breath 09/01/13   Genelle Gather, MD  amitriptyline (ELAVIL) 50 MG tablet Take 1 tablet (50 mg total) by mouth at bedtime. 04/26/14   Linna Hoff, MD  Fluticasone-Salmeterol (ADVAIR) 250-50 MCG/DOSE AEPB Inhale 1 puff into the lungs 2 (two) times daily. 09/01/13   Genelle Gather, MD  medroxyPROGESTERone (DEPO-PROVERA) 150 MG/ML injection Inject 150 mg into the muscle every 3 (three) months.    Historical Provider, MD  mometasone (NASONEX) 50 MCG/ACT nasal spray Place 2 sprays into the nose daily. 07/04/12 07/04/13  Genelle Gather, MD  naproxen sodium (ANAPROX) 550 MG tablet Take 1 tablet (550 mg total) by mouth 2 (two) times daily as needed for moderate pain. Take with food 02/02/14   Tereso Newcomer, MD  predniSONE (DELTASONE) 10 MG tablet Take 2 tablets (20 mg total) by mouth daily. 05/01/14   Dorthula Matas, PA-C  promethazine (  PHENERGAN) 12.5 MG tablet Take 1 tablet (12.5 mg total) by mouth every 6 (six) hours as needed for nausea. 11/27/12   Elyse JarvisMegha Sawhney, MD  triamcinolone cream (KENALOG) 0.1 % Apply to affected area in a thin layer 1-2 times a day for total of 14 days 09/26/13   Genelle GatherKathryn F Glenn, MD   Triage Vitals: BP 129/76  Pulse 74  Temp(Src) 99 F (37.2 C) (Oral)  Resp 20  SpO2 100%  Physical Exam  Nursing note and vitals reviewed. Constitutional: She is oriented to person, place, and time. She appears well-developed and well-nourished.  HENT:  Head: Normocephalic and atraumatic.  No tongue swelling.  No drooling.  Eyes:  EOM are normal.  Neck: Normal range of motion.  Cardiovascular: Normal rate.   Pulmonary/Chest: Effort normal. She has no wheezes.  Musculoskeletal: Normal range of motion.  No angioedema.  Neurological: She is alert and oriented to person, place, and time.  Skin: Skin is warm and dry. Rash noted.  Urticaria to upper extremities, lower extremities, spares the face and the back.    Psychiatric: She has a normal mood and affect. Her behavior is normal.    ED Course  Procedures (including critical care time)  DIAGNOSTIC STUDIES: Oxygen Saturation is 100% on room air, normal by my interpretation.    COORDINATION OF CARE: 9:33 PM- Discussed discharging the patient with Prednisone taper.  Advised the patient to take Benadryl if needed.  The patient agreed to the treatment plan.   Labs Review Labs Reviewed - No data to display  Imaging Review No results found.   EKG Interpretation None      MDM   Final diagnoses:  Urticaria   Pt looks well, no s/sx to suggest systemic reaction.  27 y.o.Sandra Oneill's evaluation in the Emergency Department is complete. It has been determined that no acute conditions requiring further emergency intervention are present at this time. The patient/guardian have been advised of the diagnosis and plan. We have discussed signs and symptoms that warrant return to the ED, such as changes or worsening in symptoms.  Vital signs are stable at discharge. Filed Vitals:   05/01/14 2102  BP: 129/76  Pulse: 74  Temp: 99 F (37.2 C)  Resp: 20    Patient/guardian has voiced understanding and agreed to follow-up with the PCP or specialist.  I personally performed the services described in this documentation, which was scribed in my presence. The recorded information has been reviewed and is accurate.   Dorthula Matasiffany G Laikynn Pollio, PA-C 05/02/14 0025

## 2014-05-02 NOTE — ED Provider Notes (Signed)
Medical screening examination/treatment/procedure(s) were performed by non-physician practitioner and as supervising physician I was immediately available for consultation/collaboration.   EKG Interpretation None        Kristen N Ward, DO 05/02/14 0035 

## 2014-05-04 ENCOUNTER — Ambulatory Visit (INDEPENDENT_AMBULATORY_CARE_PROVIDER_SITE_OTHER): Payer: Managed Care, Other (non HMO) | Admitting: Internal Medicine

## 2014-05-04 ENCOUNTER — Encounter: Payer: Self-pay | Admitting: Internal Medicine

## 2014-05-04 VITALS — BP 124/83 | HR 71 | Temp 98.8°F | Ht 62.0 in | Wt 135.9 lb

## 2014-05-04 DIAGNOSIS — R000 Tachycardia, unspecified: Secondary | ICD-10-CM

## 2014-05-04 DIAGNOSIS — R002 Palpitations: Secondary | ICD-10-CM

## 2014-05-04 DIAGNOSIS — L509 Urticaria, unspecified: Secondary | ICD-10-CM | POA: Insufficient documentation

## 2014-05-04 MED ORDER — CETIRIZINE HCL 10 MG PO TABS: 10.0000 mg | ORAL_TABLET | Freq: Every day | ORAL | Status: DC

## 2014-05-04 NOTE — Progress Notes (Signed)
   Subjective:    Patient ID: Sandra Oneill, female    DOB: 08-06-1986, 28 y.o.   MRN: [id number]  HPI Comments: Sandra Oneill is a 28 year old with hx of allergy and asthma who presents with four day c/o of hives.  They come and go, lasting a few hours at a time.  Never with urticaria before.  Very itchy started on back.  Spread to involve face arms, legs, etc.  Raised, red flat.  Welts last a few hours at a time.   No problems breathing.  [facility] the [facility].  She started itching a few minutes after she started eating.  She reports a tomato allergy (rxn = rash) but she is able to eat foods that have touched tomato and she is able to eat tomato sauce, etc.  There were no tomatoes on her hamburger.   No one else in the house is affected.  No bug bites that she is aware of.  No recent illness.  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  No new medications or new environmental exposures.  Seen in ED 3 days ago for this problem and prescribed steroid taper.  Feels it is getting a bit better with the steroid trx.  She had hives this AM but none present during this afternoon's visit.       Review of Systems  Constitutional: Negative for fever, chills and appetite change.  Respiratory: Negative for shortness of breath.   Cardiovascular: Positive for palpitations. Negative for chest pain and leg swelling.       Sometimes feels her heart racing when she lays down to sleep at night.  Gastrointestinal: Negative for nausea, vomiting and diarrhea.  Endocrine: Positive for heat intolerance. Negative for polydipsia, polyphagia and polyuria.  Genitourinary: Negative for dysuria and frequency.       Objective:   Physical Exam  Constitutional: She is oriented to person, place, and time. She appears well-developed. No distress.  HENT:  Head: Normocephalic and atraumatic.  Mouth/Throat: Oropharynx is clear and moist. No  oropharyngeal exudate.  Eyes: EOM are normal. Pupils are equal, round, and reactive to light.  Neck: Normal range of motion. Neck supple.  Cardiovascular: Normal rate, regular rhythm and normal heart sounds.  Exam reveals no gallop and no friction rub.   No murmur heard. Pulmonary/Chest: Effort normal and breath sounds normal. No respiratory distress. She has no wheezes. She has no rales.  Abdominal: Soft. Bowel sounds are normal. She exhibits no distension. There is no tenderness.  Musculoskeletal: Normal range of motion. She exhibits no edema and no tenderness.  Neurological: She is alert and oriented to person, place, and time. No cranial nerve deficit.  Skin: Skin is warm. No rash noted. She is not diaphoretic.  Psychiatric: She has a normal mood and affect. Her behavior is normal.          Assessment & Plan:  Please see problem based assessment and plan.

## 2014-05-04 NOTE — Assessment & Plan Note (Signed)
On ROS she endorses fast heart beat only when she lays down to sleep at night.  No skipped beats.  No CP or dyspnea.  She also reports heat intolerance, but denies frequent BMs or increased appetite.  Her mom has a history of thyroid dysfunction.   - will check TSH and free T4

## 2014-05-04 NOTE — Assessment & Plan Note (Addendum)
Though she has no lesions at present she has pictures in her phone showing red, raised lesions on her face and thigh.  These lesions are pruritic and transient c/w urticaria.  It is possible that she has developed a new food allergy since her symptoms began soon after eating.  She feels she is improving somewhat with the steroid taper prescribed by the ED.  VSS and no signs of angioedema or airway compromise. - continue steroid taper - stop Benadryl and start cetirizine daily since it is less sedating - RTC in four weeks if urticaria persists

## 2014-05-04 NOTE — Patient Instructions (Addendum)
1. Your hives may be due to something you ate or something in your environment.  Try and keep a food journal to see if your symptoms are occuring after certain foods.   2. Please take all medications as prescribed.  Take cetirizine daily instead of Benadryl because it is less sedating.    3. If you have worsening of your symptoms or new symptoms arise, please call the clinic ([phone]), or go to the ER immediately if symptoms are severe.   4.  Return to clinic if symptoms continue after about 4 weeks.  Hives Hives are itchy, red, swollen areas of the skin. They can vary in size and location on your body. Hives can come and go for hours or several days (acute hives) or for several weeks (chronic hives). Hives do not spread from person to person (noncontagious). They may get worse with scratching, exercise, and emotional stress. CAUSES   Allergic reaction to food, additives, or drugs.  Infections, including the common cold.  Illness, such as vasculitis, lupus, or thyroid disease.  Exposure to sunlight, heat, or cold.  Exercise.  Stress.  Contact with chemicals. SYMPTOMS   Red or white swollen patches on the skin. The patches may change size, shape, and location quickly and repeatedly.  Itching.  Swelling of the hands, feet, and face. This may occur if hives develop deeper in the skin. DIAGNOSIS  Your caregiver can usually tell what is wrong by performing a physical exam. Skin or blood tests may also be done to determine the cause of your hives. In some cases, the cause cannot be determined. TREATMENT  Mild cases usually get better with medicines such as antihistamines. Severe cases may require an emergency epinephrine injection. If the cause of your hives is known, treatment includes avoiding that trigger.  HOME CARE INSTRUCTIONS   Avoid causes that trigger your hives.  Take antihistamines as directed by your caregiver to reduce the severity of your hives. Non-sedating or  low-sedating antihistamines are usually recommended. Do not drive while taking an antihistamine.  Take any other medicines prescribed for itching as directed by your caregiver.  Wear loose-fitting clothing.  Keep all follow-up appointments as directed by your caregiver. SEEK MEDICAL CARE IF:   You have persistent or severe itching that is not relieved with medicine.  You have painful or swollen joints. SEEK IMMEDIATE MEDICAL CARE IF:   You have a fever.  Your tongue or lips are swollen.  You have trouble breathing or swallowing.  You feel tightness in the throat or chest.  You have abdominal pain. These problems may be the first sign of a life-threatening allergic reaction. Call your local emergency services (911 in [place] MAKE SURE YOU:   Understand these instructions.  Will watch your condition.  Will get help right away if you are not doing well or get worse. Document Released: 10/02/2005 Document Revised: 10/07/2013 Document Reviewed: 12/26/2011 [facility] Patient Information 2015 [place], [place]. This information is not intended to replace advice given to you by your health care provider. Make sure you discuss any questions you have with your health care provider.

## 2014-05-04 NOTE — Progress Notes (Signed)
Case discussed with Dr. Wilson at the time of the visit.  We reviewed the resident's history and exam and pertinent patient test results.  I agree with the assessment, diagnosis, and plan of care documented in the resident's note. 

## 2014-05-05 LAB — T4, FREE: FREE T4: 0.9 ng/dL (ref 0.80–1.80)

## 2014-05-05 LAB — TSH: TSH: 0.841 u[IU]/mL (ref 0.350–4.500)

## 2014-07-20 ENCOUNTER — Ambulatory Visit (INDEPENDENT_AMBULATORY_CARE_PROVIDER_SITE_OTHER): Payer: Managed Care, Other (non HMO) | Admitting: *Deleted

## 2014-07-20 VITALS — BP 134/86 | HR 86 | Ht 62.0 in

## 2014-07-20 DIAGNOSIS — Z3042 Encounter for surveillance of injectable contraceptive: Secondary | ICD-10-CM

## 2014-07-20 MED ORDER — MEDROXYPROGESTERONE ACETATE 104 MG/0.65ML ~~LOC~~ SUSP
104.0000 mg | Freq: Once | SUBCUTANEOUS | Status: AC
  Administered 2014-07-20: 104 mg via SUBCUTANEOUS

## 2014-07-28 ENCOUNTER — Other Ambulatory Visit: Payer: Self-pay | Admitting: Internal Medicine

## 2014-07-29 NOTE — Telephone Encounter (Signed)
Message sent to front office to schedule pt an appt. 

## 2014-07-29 NOTE — Telephone Encounter (Signed)
Ms. Sandra Oneill needs an appointment in the clinic. Any provider is ok.

## 2014-08-12 ENCOUNTER — Encounter: Payer: Self-pay | Admitting: Internal Medicine

## 2014-08-12 ENCOUNTER — Ambulatory Visit: Payer: Managed Care, Other (non HMO) | Admitting: Internal Medicine

## 2014-10-12 ENCOUNTER — Encounter: Payer: Self-pay | Admitting: Family Medicine

## 2014-10-12 ENCOUNTER — Ambulatory Visit (INDEPENDENT_AMBULATORY_CARE_PROVIDER_SITE_OTHER): Payer: Managed Care, Other (non HMO) | Admitting: *Deleted

## 2014-10-12 ENCOUNTER — Encounter: Payer: Self-pay | Admitting: *Deleted

## 2014-10-12 VITALS — BP 143/79 | HR 93 | Temp 98.3°F | Wt 146.0 lb

## 2014-10-12 DIAGNOSIS — Z3042 Encounter for surveillance of injectable contraceptive: Secondary | ICD-10-CM

## 2014-10-12 MED ORDER — MEDROXYPROGESTERONE ACETATE 104 MG/0.65ML ~~LOC~~ SUSP
104.0000 mg | Freq: Once | SUBCUTANEOUS | Status: AC
  Administered 2014-10-12: 104 mg via SUBCUTANEOUS

## 2014-10-12 NOTE — Progress Notes (Signed)
Depo Provera 104 mg sq right thigh

## 2014-11-25 ENCOUNTER — Ambulatory Visit (INDEPENDENT_AMBULATORY_CARE_PROVIDER_SITE_OTHER): Payer: Managed Care, Other (non HMO) | Admitting: Internal Medicine

## 2014-11-25 ENCOUNTER — Encounter: Payer: Self-pay | Admitting: Internal Medicine

## 2014-11-25 VITALS — BP 130/85 | HR 81 | Temp 98.2°F | Ht 62.0 in | Wt 149.5 lb

## 2014-11-25 DIAGNOSIS — L209 Atopic dermatitis, unspecified: Secondary | ICD-10-CM

## 2014-11-25 DIAGNOSIS — J309 Allergic rhinitis, unspecified: Secondary | ICD-10-CM

## 2014-11-25 DIAGNOSIS — Z0000 Encounter for general adult medical examination without abnormal findings: Secondary | ICD-10-CM

## 2014-11-25 DIAGNOSIS — J4520 Mild intermittent asthma, uncomplicated: Secondary | ICD-10-CM

## 2014-11-25 DIAGNOSIS — J3089 Other allergic rhinitis: Secondary | ICD-10-CM | POA: Insufficient documentation

## 2014-11-25 DIAGNOSIS — J302 Other seasonal allergic rhinitis: Secondary | ICD-10-CM

## 2014-11-25 DIAGNOSIS — Z7951 Long term (current) use of inhaled steroids: Secondary | ICD-10-CM

## 2014-11-25 MED ORDER — CETIRIZINE HCL 10 MG PO TABS: 10.0000 mg | ORAL_TABLET | Freq: Every day | ORAL | Status: DC

## 2014-11-25 MED ORDER — FLUTICASONE-SALMETEROL 250-50 MCG/DOSE IN AEPB: 1.0000 | INHALATION_SPRAY | Freq: Two times a day (BID) | RESPIRATORY_TRACT | Status: DC

## 2014-11-25 MED ORDER — TRIAMCINOLONE ACETONIDE 0.1 % EX CREA: TOPICAL_CREAM | CUTANEOUS | Status: DC

## 2014-11-25 MED ORDER — ALBUTEROL SULFATE HFA 108 (90 BASE) MCG/ACT IN AERS: 2.0000 | INHALATION_SPRAY | Freq: Four times a day (QID) | RESPIRATORY_TRACT | Status: DC | PRN

## 2014-11-25 MED ORDER — FLUTICASONE PROPIONATE 50 MCG/ACT NA SUSP: 2.0000 | Freq: Every day | NASAL | Status: DC

## 2014-11-25 NOTE — Assessment & Plan Note (Signed)
Flu vaccine: October 2015 Pap smear: Due. Ob/gyn appointment on March 2nd 2016

## 2014-11-25 NOTE — Assessment & Plan Note (Signed)
Well controlled. Patient uses her advair BID and albuterol prn. She uses her albuterol inhaler once every 1-2 weeks. No nocturnal symptoms. Her last asthma exacerbation was in 2006.   Plan: -Refilled Advair -Refilled Albuterol

## 2014-11-25 NOTE — Assessment & Plan Note (Signed)
Patient complains of eczematous skin changes on her hands, arms and face for many years. She has been using triamcinolone cream as treatment and requests refills. Unfortunately, patient has been using triamcinolone cream on her face without knowledge of side effects. Patient educated on side effects and reminded to only use on body and to avoid the face.   Plan: -Continue triamcinolone cream on hands and arms -Use lotion on face -If patient requests something stronger for face in future, can consider calcineurin inhibitor

## 2014-11-25 NOTE — Patient Instructions (Signed)
General Instructions:   Thank you for bringing your medicines today. This helps us keep you safe from mistakes.  Eczema Eczema, also called atopic dermatitis, is a skin disorder that causes inflammation of the skin. It causes a red rash and dry, scaly skin. The skin becomes very itchy. Eczema is generally worse during the cooler winter months and often improves with the warmth of summer. Eczema usually starts showing signs in infancy. Some children outgrow eczema, but it may last through adulthood.  CAUSES  The exact cause of eczema is not known, but it appears to run in families. People with eczema often have a family history of eczema, allergies, asthma, or hay fever. Eczema is not contagious. Flare-ups of the condition may be caused by:   Contact with something you are sensitive or allergic to.   Stress. SIGNS AND SYMPTOMS  Dry, scaly skin.   Red, itchy rash.   Itchiness. This may occur before the skin rash and may be very intense.  DIAGNOSIS  The diagnosis of eczema is usually made based on symptoms and medical history. TREATMENT  Eczema cannot be cured, but symptoms usually can be controlled with treatment and other strategies. A treatment plan might include:  Controlling the itching and scratching.   Use over-the-counter antihistamines as directed for itching. This is especially useful at night when the itching tends to be worse.   Use over-the-counter steroid creams as directed for itching.   Avoid scratching. Scratching makes the rash and itching worse. It may also result in a skin infection (impetigo) due to a break in the skin caused by scratching.   Keeping the skin well moisturized with creams every day. This will seal in moisture and help prevent dryness. Lotions that contain alcohol and water should be avoided because they can dry the skin.   Limiting exposure to things that you are sensitive or allergic to (allergens).   Recognizing situations that cause  stress.   Developing a plan to manage stress.  HOME CARE INSTRUCTIONS   Only take over-the-counter or prescription medicines as directed by your health care provider.   Do not use anything on the skin without checking with your health care provider.   Keep baths or showers short (5 minutes) in warm (not hot) water. Use mild cleansers for bathing. These should be unscented. You may add nonperfumed bath oil to the bath water. It is best to avoid soap and bubble bath.   Immediately after a bath or shower, when the skin is still damp, apply a moisturizing ointment to the entire body. This ointment should be a petroleum ointment. This will seal in moisture and help prevent dryness. The thicker the ointment, the better. These should be unscented.   Keep fingernails cut short. Children with eczema may need to wear soft gloves or mittens at night after applying an ointment.   Dress in clothes made of cotton or cotton blends. Dress lightly, because heat increases itching.   A child with eczema should stay away from anyone with fever blisters or cold sores. The virus that causes fever blisters (herpes simplex) can cause a serious skin infection in children with eczema. SEEK MEDICAL CARE IF:   Your itching interferes with sleep.   Your rash gets worse or is not better within 1 week after starting treatment.   You see pus or soft yellow scabs in the rash area.   You have a fever.   You have a rash flare-up after contact with someone who has  fever blisters.  Document Released: 09/29/2000 Document Revised: 07/23/2013 Document Reviewed: 05/05/2013 [facility] Patient Information 2015 [place], [place]. This information is not intended to replace advice given to you by your health care provider. Make sure you discuss any questions you have with your health care provider.

## 2014-11-25 NOTE — Progress Notes (Signed)
   Subjective:    Patient ID: Sandra Oneill, female    DOB: 24-Oct-1985, 29 y.o.   MRN: [id number]  HPI Sandra Oneill is a 29 yo female with PMHx of asthma and atopic dermatitis who presents for follow up. Please see problem oriented assessment and plan for more information.  Review of Systems General: Denies fever, chills, fatigue HEENT: Admits to dry, itchy, watery eyes. Admits to nasal congestion and rhinorrhea. Denies sore throat. Denies ear pain. Denies sinus pressure and pain. Respiratory: Denies SOB, cough, DOE, chest tightness, and wheezing.   Cardiovascular: Denies chest pain Gastrointestinal: Denies nausea, vomiting  Genitourinary: Denies dysuria, urgency, frequency, hematuria, suprapubic pain and flank pain. Skin: Admits to dry, scaly rash on hands, arms and face.   Neurological: Denies dizziness, headaches  Past Medical History  Diagnosis Date  . Abnormal Pap smear     3-4 years ago  . Asthma    Outpatient Encounter Prescriptions as of 11/25/2014  Medication Sig  . albuterol (PROVENTIL HFA;VENTOLIN HFA) 108 (90 BASE) MCG/ACT inhaler Inhale 2 puffs into the lungs every 6 (six) hours as needed. For shortness of breath  . cetirizine (ZYRTEC) 10 MG tablet Take 1 tablet (10 mg total) by mouth daily.  . Fluticasone-Salmeterol (ADVAIR) 250-50 MCG/DOSE AEPB Inhale 1 puff into the lungs 2 (two) times daily.  . medroxyPROGESTERone (DEPO-PROVERA) 150 MG/ML injection Inject 150 mg into the muscle every 3 (three) months.  . mometasone (NASONEX) 50 MCG/ACT nasal spray Place 2 sprays into the nose daily.  . predniSONE (DELTASONE) 10 MG tablet Take 2 tablets (20 mg total) by mouth daily.  Marland Kitchen. triamcinolone cream (KENALOG) 0.1 % Apply to affected area in a thin layer 1-2 times a day for total of 14 days      Objective:   Physical Exam Filed Vitals:   11/25/14 1339  BP: 130/85  Pulse: 81  Temp: 98.2 F (36.8 C)  TempSrc: Oral  Height: 5\' 2"  (1.575 m)  Weight: 149 lb 8 oz (67.813 kg)    SpO2: 100%   General: Vital signs reviewed.  Patient is well-developed and well-nourished, in no acute distress and cooperative with exam.  HEENT: Conjunctivae normal, no injection. Tympanic membranes normal bilaterally. Nasal turbinates erythematous and edematous. No sinus tenderness on palpation. Posterior oropharynx is pink and moist without exudate.  Cardiovascular: RRR, S1 normal, S2 normal, no murmurs, gallops, or rubs. Pulmonary/Chest: Clear to auscultation bilaterally, no wheezes, rales, or rhonchi. Abdominal: Soft, non-tender, non-distended, BS  Extremities: No lower extremity edema bilaterally Skin: Warm, dry and intact. Dry skin notes on hands, no obvious rash. Facial skin is normal, without rash or evidence of atrophy or hypopigmentation. Psychiatric: Normal mood and affect.     Assessment & Plan:   Please see problem based assessment and plan.

## 2014-11-25 NOTE — Progress Notes (Signed)
Case discussed with Dr. Richardson at time of visit. We reviewed the resident's history and exam and pertinent patient test results. I agree with the assessment, diagnosis, and plan of care documented in the resident's note. 

## 2014-11-25 NOTE — Assessment & Plan Note (Signed)
Patient complains of itchy watery eyes and nasal congestion which is chronic for her and flares up in the fall and winter months. Patient has been using cetirizine at night, but has not been using mometasone consistently. She does not think the Nasonex was working for her previously.  Plan: -Continue cetirizine 10 mg QHS (due to sedation) -Try Flonase 2 sprays daily  -If Flonase does not work, can consider Atrovent nasal spray or combination antihistamine and steroid spray (Dymista- azelastine + fluticasone); however, these will likely be more expensive

## 2014-12-16 ENCOUNTER — Ambulatory Visit: Payer: Managed Care, Other (non HMO) | Admitting: Obstetrics & Gynecology

## 2014-12-30 ENCOUNTER — Encounter: Payer: Self-pay | Admitting: Obstetrics & Gynecology

## 2014-12-30 ENCOUNTER — Ambulatory Visit (INDEPENDENT_AMBULATORY_CARE_PROVIDER_SITE_OTHER): Payer: Managed Care, Other (non HMO) | Admitting: Obstetrics & Gynecology

## 2014-12-30 VITALS — BP 116/67 | HR 77 | Temp 98.9°F | Ht 62.0 in | Wt 146.2 lb

## 2014-12-30 DIAGNOSIS — B373 Candidiasis of vulva and vagina: Secondary | ICD-10-CM

## 2014-12-30 DIAGNOSIS — Z30019 Encounter for initial prescription of contraceptives, unspecified: Secondary | ICD-10-CM

## 2014-12-30 DIAGNOSIS — Z124 Encounter for screening for malignant neoplasm of cervix: Secondary | ICD-10-CM

## 2014-12-30 DIAGNOSIS — B3731 Acute candidiasis of vulva and vagina: Secondary | ICD-10-CM

## 2014-12-30 DIAGNOSIS — Z01419 Encounter for gynecological examination (general) (routine) without abnormal findings: Secondary | ICD-10-CM

## 2014-12-30 DIAGNOSIS — Z113 Encounter for screening for infections with a predominantly sexual mode of transmission: Secondary | ICD-10-CM

## 2014-12-30 DIAGNOSIS — Z118 Encounter for screening for other infectious and parasitic diseases: Secondary | ICD-10-CM

## 2014-12-30 LAB — RPR

## 2014-12-30 LAB — HEPATITIS B SURFACE ANTIGEN: Hepatitis B Surface Ag: NEGATIVE

## 2014-12-30 LAB — HEPATITIS C ANTIBODY: HCV AB: NEGATIVE

## 2014-12-30 MED ORDER — LEVONORGEST-ETH ESTRAD 91-DAY 0.15-0.03 MG PO TABS: 1.0000 | ORAL_TABLET | Freq: Every day | ORAL | Status: DC

## 2014-12-30 NOTE — Progress Notes (Signed)
    GYNECOLOGY CLINIC ANNUAL PREVENTATIVE CARE ENCOUNTER NOTE  Subjective:     Sandra Oneill is a 29 y.o. G0P0 female here for a routine annual gynecologic exam.  Wants OCPs instead of Depo Provera.  Also wants STI screen.      Gynecologic History No LMP recorded. Patient has had an injection. Contraception: Depo-Provera injections; desires switch to OCPs Last Pap: 12/06/12. Results were: normal  Obstetric History OB History  Gravida Para Term Preterm AB SAB TAB Ectopic Multiple Living  0                 History   Social History  . Marital Status: Single    Spouse Name: N/A  . Number of Children: N/A  . Years of Education: N/A   Occupational History  . Not on file.   Social History Main Topics  . Smoking status: Former Smoker -- 0.10 packs/day    Types: Cigars    Quit date: 08/07/2011  . Smokeless tobacco: Never Used  . Alcohol Use: 0.0 oz/week    0 Standard drinks or equivalent per week     Comment: occasional  . Drug Use: No  . Sexual Activity: Yes    Birth Control/ Protection: Injection   Other Topics Concern  . Not on file   Social History Narrative    The following portions of the patient's history were reviewed and updated as appropriate: allergies, current medications, past family history, past medical history, past social history, past surgical history and problem list.  Review of Systems A comprehensive review of systems was negative.   Objective:   BP 116/67 mmHg  Pulse 77  Temp(Src) 98.9 F (37.2 C)  Ht 5\' 2"  (1.575 m)  Wt 146 lb 3.2 oz (66.316 kg)  BMI 26.73 kg/m2 GENERAL: Well-developed, well-nourished female in no acute distress.  HEENT: Normocephalic, atraumatic. Sclerae anicteric.  NECK: Supple. Normal thyroid.  LUNGS: Clear to auscultation bilaterally.  HEART: Regular rate and rhythm. BREASTS: Symmetric in size. No masses, skin changes, nipple drainage, or lymphadenopathy. ABDOMEN: Soft, nontender, nondistended. No  organomegaly. PELVIC: Normal external female genitalia. Vagina is pink and rugated.  Normal discharge. Normal cervix contour. Pap smear obtained. Uterus is normal in size. No adnexal mass or tenderness.  EXTREMITIES: No cyanosis, clubbing, or edema, 2+ distal pulses.   Assessment:   Annual gynecologic examination Contraception management   Plan:   Pap and STI screen  done, will follow up results and manage accordingly. Seasonale prescribed as per patient preference Routine preventative health maintenance measures emphasized   Jaynie CollinsUGONNA  Meeka Cartelli, MD, FACOG Attending Obstetrician & Gynecologist [facility], [facility]

## 2014-12-30 NOTE — Patient Instructions (Signed)
Preventive Care for Adults A healthy lifestyle and preventive care can promote health and wellness. Preventive health guidelines for women include the following key practices.  A routine yearly physical is a good way to check with your health care provider about your health and preventive screening. It is a chance to share any concerns and updates on your health and to receive a thorough exam.  Visit your dentist for a routine exam and preventive care every 6 months. Brush your teeth twice a day and floss once a day. Good oral hygiene prevents tooth decay and gum disease.  The frequency of eye exams is based on your age, health, family medical history, use of contact lenses, and other factors. Follow your health care provider's recommendations for frequency of eye exams.  Eat a healthy diet. Foods like vegetables, fruits, whole grains, low-fat dairy products, and lean protein foods contain the nutrients you need without too many calories. Decrease your intake of foods high in solid fats, added sugars, and salt. Eat the right amount of calories for you.Get information about a proper diet from your health care provider, if necessary.  Regular physical exercise is one of the most important things you can do for your health. Most adults should get at least 150 minutes of moderate-intensity exercise (any activity that increases your heart rate and causes you to sweat) each week. In addition, most adults need muscle-strengthening exercises on 2 or more days a week.  Maintain a healthy weight. The body mass index (BMI) is a screening tool to identify possible weight problems. It provides an estimate of body fat based on height and weight. Your health care provider can find your BMI and can help you achieve or maintain a healthy weight.For adults 20 years and older:  A BMI below 18.5 is considered underweight.  A BMI of 18.5 to 24.9 is normal.  A BMI of 25 to 29.9 is considered overweight.  A BMI of  30 and above is considered obese.  Maintain normal blood lipids and cholesterol levels by exercising and minimizing your intake of saturated fat. Eat a balanced diet with plenty of fruit and vegetables. Blood tests for lipids and cholesterol should begin at age 76 and be repeated every 5 years. If your lipid or cholesterol levels are high, you are over 50, or you are at high risk for heart disease, you may need your cholesterol levels checked more frequently.Ongoing high lipid and cholesterol levels should be treated with medicines if diet and exercise are not working.  If you smoke, find out from your health care provider how to quit. If you do not use tobacco, do not start.  Lung cancer screening is recommended for adults aged 22-80 years who are at high risk for developing lung cancer because of a history of smoking. A yearly low-dose CT scan of the lungs is recommended for people who have at least a 30-pack-year history of smoking and are a current smoker or have quit within the past 15 years. A pack year of smoking is smoking an average of 1 pack of cigarettes a day for 1 year (for example: 1 pack a day for 30 years or 2 packs a day for 15 years). Yearly screening should continue until the smoker has stopped smoking for at least 15 years. Yearly screening should be stopped for people who develop a health problem that would prevent them from having lung cancer treatment.  If you are pregnant, do not drink alcohol. If you are breastfeeding,  be very cautious about drinking alcohol. If you are not pregnant and choose to drink alcohol, do not have more than 1 drink per day. One drink is considered to be 12 ounces (355 mL) of beer, 5 ounces (148 mL) of wine, or 1.5 ounces (44 mL) of liquor.  Avoid use of street drugs. Do not share needles with anyone. Ask for help if you need support or instructions about stopping the use of drugs.  High blood pressure causes heart disease and increases the risk of  stroke. Your blood pressure should be checked at least every 1 to 2 years. Ongoing high blood pressure should be treated with medicines if weight loss and exercise do not work.  If you are 75-52 years old, ask your health care provider if you should take aspirin to prevent strokes.  Diabetes screening involves taking a blood sample to check your fasting blood sugar level. This should be done once every 3 years, after age 15, if you are within normal weight and without risk factors for diabetes. Testing should be considered at a younger age or be carried out more frequently if you are overweight and have at least 1 risk factor for diabetes.  Breast cancer screening is essential preventive care for women. You should practice "breast self-awareness." This means understanding the normal appearance and feel of your breasts and may include breast self-examination. Any changes detected, no matter how small, should be reported to a health care provider. Women in their 58s and 30s should have a clinical breast exam (CBE) by a health care provider as part of a regular health exam every 1 to 3 years. After age 16, women should have a CBE every year. Starting at age 53, women should consider having a mammogram (breast X-ray test) every year. Women who have a family history of breast cancer should talk to their health care provider about genetic screening. Women at a high risk of breast cancer should talk to their health care providers about having an MRI and a mammogram every year.  Breast cancer gene (BRCA)-related cancer risk assessment is recommended for women who have family members with BRCA-related cancers. BRCA-related cancers include breast, ovarian, tubal, and peritoneal cancers. Having family members with these cancers may be associated with an increased risk for harmful changes (mutations) in the breast cancer genes BRCA1 and BRCA2. Results of the assessment will determine the need for genetic counseling and  BRCA1 and BRCA2 testing.  Routine pelvic exams to screen for cancer are no longer recommended for nonpregnant women who are considered low risk for cancer of the pelvic organs (ovaries, uterus, and vagina) and who do not have symptoms. Ask your health care provider if a screening pelvic exam is right for you.  If you have had past treatment for cervical cancer or a condition that could lead to cancer, you need Pap tests and screening for cancer for at least 20 years after your treatment. If Pap tests have been discontinued, your risk factors (such as having a new sexual partner) need to be reassessed to determine if screening should be resumed. Some women have medical problems that increase the chance of getting cervical cancer. In these cases, your health care provider may recommend more frequent screening and Pap tests.  The HPV test is an additional test that may be used for cervical cancer screening. The HPV test looks for the virus that can cause the cell changes on the cervix. The cells collected during the Pap test can be  tested for HPV. The HPV test could be used to screen women aged 91 years and older, and should be used in women of any age who have unclear Pap test results. After the age of 67, women should have HPV testing at the same frequency as a Pap test.  Colorectal cancer can be detected and often prevented. Most routine colorectal cancer screening begins at the age of 48 years and continues through age 33 years. However, your health care provider may recommend screening at an earlier age if you have risk factors for colon cancer. On a yearly basis, your health care provider may provide home test kits to check for hidden blood in the stool. Use of a small camera at the end of a tube, to directly examine the colon (sigmoidoscopy or colonoscopy), can detect the earliest forms of colorectal cancer. Talk to your health care provider about this at age 34, when routine screening begins. Direct  exam of the colon should be repeated every 5-10 years through age 78 years, unless early forms of pre-cancerous polyps or small growths are found.  People who are at an increased risk for hepatitis B should be screened for this virus. You are considered at high risk for hepatitis B if:  You were born in a country where hepatitis B occurs often. Talk with your health care provider about which countries are considered high risk.  Your parents were born in a high-risk country and you have not received a shot to protect against hepatitis B (hepatitis B vaccine).  You have HIV or AIDS.  You use needles to inject street drugs.  You live with, or have sex with, someone who has hepatitis B.  You get hemodialysis treatment.  You take certain medicines for conditions like cancer, organ transplantation, and autoimmune conditions.  Hepatitis C blood testing is recommended for all people born from [birth year] through 1965 and any individual with known risks for hepatitis C.  Practice safe sex. Use condoms and avoid high-risk sexual practices to reduce the spread of sexually transmitted infections (STIs). STIs include gonorrhea, chlamydia, syphilis, trichomonas, herpes, HPV, and human immunodeficiency virus (HIV). Herpes, HIV, and HPV are viral illnesses that have no cure. They can result in disability, cancer, and death.  You should be screened for sexually transmitted illnesses (STIs) including gonorrhea and chlamydia if:  You are sexually active and are younger than 24 years.  You are older than 24 years and your health care provider tells you that you are at risk for this type of infection.  Your sexual activity has changed since you were last screened and you are at an increased risk for chlamydia or gonorrhea. Ask your health care provider if you are at risk.  If you are at risk of being infected with HIV, it is recommended that you take a prescription medicine daily to prevent HIV infection. This is  called preexposure prophylaxis (PrEP). You are considered at risk if:  You are a heterosexual woman, are sexually active, and are at increased risk for HIV infection.  You take drugs by injection.  You are sexually active with a partner who has HIV.  Talk with your health care provider about whether you are at high risk of being infected with HIV. If you choose to begin PrEP, you should first be tested for HIV. You should then be tested every 3 months for as long as you are taking PrEP.  Osteoporosis is a disease in which the bones lose minerals and strength  with aging. This can result in serious bone fractures or breaks. The risk of osteoporosis can be identified using a bone density scan. Women ages 65 years and over and women at risk for fractures or osteoporosis should discuss screening with their health care providers. Ask your health care provider whether you should take a calcium supplement or vitamin D to reduce the rate of osteoporosis.  Menopause can be associated with physical symptoms and risks. Hormone replacement therapy is available to decrease symptoms and risks. You should talk to your health care provider about whether hormone replacement therapy is right for you.  Use sunscreen. Apply sunscreen liberally and repeatedly throughout the day. You should seek shade when your shadow is shorter than you. Protect yourself by wearing long sleeves, pants, a wide-brimmed hat, and sunglasses year round, whenever you are outdoors.  Once a month, do a whole body skin exam, using a mirror to look at the skin on your back. Tell your health care provider of new moles, moles that have irregular borders, moles that are larger than a pencil eraser, or moles that have changed in shape or color.  Stay current with required vaccines (immunizations).  Influenza vaccine. All adults should be immunized every year.  Tetanus, diphtheria, and acellular pertussis (Td, Tdap) vaccine. Pregnant women should  receive 1 dose of Tdap vaccine during each pregnancy. The dose should be obtained regardless of the length of time since the last dose. Immunization is preferred during the 27th-36th week of gestation. An adult who has not previously received Tdap or who does not know her vaccine status should receive 1 dose of Tdap. This initial dose should be followed by tetanus and diphtheria toxoids (Td) booster doses every 10 years. Adults with an unknown or incomplete history of completing a 3-dose immunization series with Td-containing vaccines should begin or complete a primary immunization series including a Tdap dose. Adults should receive a Td booster every 10 years.  Varicella vaccine. An adult without evidence of immunity to varicella should receive 2 doses or a second dose if she has previously received 1 dose. Pregnant females who do not have evidence of immunity should receive the first dose after pregnancy. This first dose should be obtained before leaving the health care facility. The second dose should be obtained 4-8 weeks after the first dose.  Human papillomavirus (HPV) vaccine. Females aged 13-26 years who have not received the vaccine previously should obtain the 3-dose series. The vaccine is not recommended for use in pregnant females. However, pregnancy testing is not needed before receiving a dose. If a female is found to be pregnant after receiving a dose, no treatment is needed. In that case, the remaining doses should be delayed until after the pregnancy. Immunization is recommended for any person with an immunocompromised condition through the age of 26 years if she did not get any or all doses earlier. During the 3-dose series, the second dose should be obtained 4-8 weeks after the first dose. The third dose should be obtained 24 weeks after the first dose and 16 weeks after the second dose.  Zoster vaccine. One dose is recommended for adults aged 60 years or older unless certain conditions are  present.  Measles, mumps, and rubella (MMR) vaccine. Adults born before 1957 generally are considered immune to measles and mumps. Adults born in 1957 or later should have 1 or more doses of MMR vaccine unless there is a contraindication to the vaccine or there is laboratory evidence of immunity to   each of the three diseases. A routine second dose of MMR vaccine should be obtained at least 28 days after the first dose for students attending postsecondary schools, health care workers, or international travelers. People who received inactivated measles vaccine or an unknown type of measles vaccine during 1963-1967 should receive 2 doses of MMR vaccine. People who received inactivated mumps vaccine or an unknown type of mumps vaccine before 1979 and are at high risk for mumps infection should consider immunization with 2 doses of MMR vaccine. For females of childbearing age, rubella immunity should be determined. If there is no evidence of immunity, females who are not pregnant should be vaccinated. If there is no evidence of immunity, females who are pregnant should delay immunization until after pregnancy. Unvaccinated health care workers born before 1957 who lack laboratory evidence of measles, mumps, or rubella immunity or laboratory confirmation of disease should consider measles and mumps immunization with 2 doses of MMR vaccine or rubella immunization with 1 dose of MMR vaccine.  Pneumococcal 13-valent conjugate (PCV13) vaccine. When indicated, a person who is uncertain of her immunization history and has no record of immunization should receive the PCV13 vaccine. An adult aged 19 years or older who has certain medical conditions and has not been previously immunized should receive 1 dose of PCV13 vaccine. This PCV13 should be followed with a dose of pneumococcal polysaccharide (PPSV23) vaccine. The PPSV23 vaccine dose should be obtained at least 8 weeks after the dose of PCV13 vaccine. An adult aged 19  years or older who has certain medical conditions and previously received 1 or more doses of PPSV23 vaccine should receive 1 dose of PCV13. The PCV13 vaccine dose should be obtained 1 or more years after the last PPSV23 vaccine dose.  Pneumococcal polysaccharide (PPSV23) vaccine. When PCV13 is also indicated, PCV13 should be obtained first. All adults aged 65 years and older should be immunized. An adult younger than age 65 years who has certain medical conditions should be immunized. Any person who resides in a nursing home or long-term care facility should be immunized. An adult smoker should be immunized. People with an immunocompromised condition and certain other conditions should receive both PCV13 and PPSV23 vaccines. People with human immunodeficiency virus (HIV) infection should be immunized as soon as possible after diagnosis. Immunization during chemotherapy or radiation therapy should be avoided. Routine use of PPSV23 vaccine is not recommended for American Indians, [place] Natives, or people younger than 65 years unless there are medical conditions that require PPSV23 vaccine. When indicated, people who have unknown immunization and have no record of immunization should receive PPSV23 vaccine. One-time revaccination 5 years after the first dose of PPSV23 is recommended for people aged 19-64 years who have chronic kidney failure, nephrotic syndrome, asplenia, or immunocompromised conditions. People who received 1-2 doses of PPSV23 before age 65 years should receive another dose of PPSV23 vaccine at age 65 years or later if at least 5 years have passed since the previous dose. Doses of PPSV23 are not needed for people immunized with PPSV23 at or after age 65 years.  Meningococcal vaccine. Adults with asplenia or persistent complement component deficiencies should receive 2 doses of quadrivalent meningococcal conjugate (MenACWY-D) vaccine. The doses should be obtained at least 2 months apart.  Microbiologists working with certain meningococcal bacteria, military recruits, people at risk during an outbreak, and people who travel to or live in countries with a high rate of meningitis should be immunized. A first-year college student up through age   21 years who is living in a residence hall should receive a dose if she did not receive a dose on or after her 16th birthday. Adults who have certain high-risk conditions should receive one or more doses of vaccine.  Hepatitis A vaccine. Adults who wish to be protected from this disease, have certain high-risk conditions, work with hepatitis A-infected animals, work in hepatitis A research labs, or travel to or work in countries with a high rate of hepatitis A should be immunized. Adults who were previously unvaccinated and who anticipate close contact with an international adoptee during the first 60 days after arrival in the [place] from a country with a high rate of hepatitis A should be immunized.  Hepatitis B vaccine. Adults who wish to be protected from this disease, have certain high-risk conditions, may be exposed to blood or other infectious body fluids, are household contacts or sex partners of hepatitis B positive people, are clients or workers in certain care facilities, or travel to or work in countries with a high rate of hepatitis B should be immunized.  Haemophilus influenzae type b (Hib) vaccine. A previously unvaccinated person with asplenia or sickle cell disease or having a scheduled splenectomy should receive 1 dose of Hib vaccine. Regardless of previous immunization, a recipient of a hematopoietic stem cell transplant should receive a 3-dose series 6-12 months after her successful transplant. Hib vaccine is not recommended for adults with HIV infection. Preventive Services / Frequency Ages 64 to 68 years  Blood pressure check.** / Every 1 to 2 years.  Lipid and cholesterol check.** / Every 5 years beginning at age  22.  Clinical breast exam.** / Every 3 years for women in their 88s and 53s.  BRCA-related cancer risk assessment.** / For women who have family members with a BRCA-related cancer (breast, ovarian, tubal, or peritoneal cancers).  Pap test.** / Every 2 years from ages 90 through 51. Every 3 years starting at age 21 through age 56 or 3 with a history of 3 consecutive normal Pap tests.  HPV screening.** / Every 3 years from ages 24 through ages 1 to 46 with a history of 3 consecutive normal Pap tests.  Hepatitis C blood test.** / For any individual with known risks for hepatitis C.  Skin self-exam. / Monthly.  Influenza vaccine. / Every year.  Tetanus, diphtheria, and acellular pertussis (Tdap, Td) vaccine.** / Consult your health care provider. Pregnant women should receive 1 dose of Tdap vaccine during each pregnancy. 1 dose of Td every 10 years.  Varicella vaccine.** / Consult your health care provider. Pregnant females who do not have evidence of immunity should receive the first dose after pregnancy.  HPV vaccine. / 3 doses over 6 months, if 72 and younger. The vaccine is not recommended for use in pregnant females. However, pregnancy testing is not needed before receiving a dose.  Measles, mumps, rubella (MMR) vaccine.** / You need at least 1 dose of MMR if you were born in 1957 or later. You may also need a 2nd dose. For females of childbearing age, rubella immunity should be determined. If there is no evidence of immunity, females who are not pregnant should be vaccinated. If there is no evidence of immunity, females who are pregnant should delay immunization until after pregnancy.  Pneumococcal 13-valent conjugate (PCV13) vaccine.** / Consult your health care provider.  Pneumococcal polysaccharide (PPSV23) vaccine.** / 1 to 2 doses if you smoke cigarettes or if you have certain conditions.  Meningococcal vaccine.** /  1 dose if you are age 19 to 21 years and a first-year college  student living in a residence hall, or have one of several medical conditions, you need to get vaccinated against meningococcal disease. You may also need additional booster doses.  Hepatitis A vaccine.** / Consult your health care provider.  Hepatitis B vaccine.** / Consult your health care provider.  Haemophilus influenzae type b (Hib) vaccine.** / Consult your health care provider. Ages 40 to 64 years  Blood pressure check.** / Every 1 to 2 years.  Lipid and cholesterol check.** / Every 5 years beginning at age 20 years.  Lung cancer screening. / Every year if you are aged 55-80 years and have a 30-pack-year history of smoking and currently smoke or have quit within the past 15 years. Yearly screening is stopped once you have quit smoking for at least 15 years or develop a health problem that would prevent you from having lung cancer treatment.  Clinical breast exam.** / Every year after age 40 years.  BRCA-related cancer risk assessment.** / For women who have family members with a BRCA-related cancer (breast, ovarian, tubal, or peritoneal cancers).  Mammogram.** / Every year beginning at age 40 years and continuing for as long as you are in good health. Consult with your health care provider.  Pap test.** / Every 3 years starting at age 30 years through age 65 or 70 years with a history of 3 consecutive normal Pap tests.  HPV screening.** / Every 3 years from ages 30 years through ages 65 to 70 years with a history of 3 consecutive normal Pap tests.  Fecal occult blood test (FOBT) of stool. / Every year beginning at age 50 years and continuing until age 75 years. You may not need to do this test if you get a colonoscopy every 10 years.  Flexible sigmoidoscopy or colonoscopy.** / Every 5 years for a flexible sigmoidoscopy or every 10 years for a colonoscopy beginning at age 50 years and continuing until age 75 years.  Hepatitis C blood test.** / For all people born from 1945 through  1965 and any individual with known risks for hepatitis C.  Skin self-exam. / Monthly.  Influenza vaccine. / Every year.  Tetanus, diphtheria, and acellular pertussis (Tdap/Td) vaccine.** / Consult your health care provider. Pregnant women should receive 1 dose of Tdap vaccine during each pregnancy. 1 dose of Td every 10 years.  Varicella vaccine.** / Consult your health care provider. Pregnant females who do not have evidence of immunity should receive the first dose after pregnancy.  Zoster vaccine.** / 1 dose for adults aged 60 years or older.  Measles, mumps, rubella (MMR) vaccine.** / You need at least 1 dose of MMR if you were born in 1957 or later. You may also need a 2nd dose. For females of childbearing age, rubella immunity should be determined. If there is no evidence of immunity, females who are not pregnant should be vaccinated. If there is no evidence of immunity, females who are pregnant should delay immunization until after pregnancy.  Pneumococcal 13-valent conjugate (PCV13) vaccine.** / Consult your health care provider.  Pneumococcal polysaccharide (PPSV23) vaccine.** / 1 to 2 doses if you smoke cigarettes or if you have certain conditions.  Meningococcal vaccine.** / Consult your health care provider.  Hepatitis A vaccine.** / Consult your health care provider.  Hepatitis B vaccine.** / Consult your health care provider.  Haemophilus influenzae type b (Hib) vaccine.** / Consult your health care provider. Ages 65   years and over  Blood pressure check.** / Every 1 to 2 years.  Lipid and cholesterol check.** / Every 5 years beginning at age 22 years.  Lung cancer screening. / Every year if you are aged 73-80 years and have a 30-pack-year history of smoking and currently smoke or have quit within the past 15 years. Yearly screening is stopped once you have quit smoking for at least 15 years or develop a health problem that would prevent you from having lung cancer  treatment.  Clinical breast exam.** / Every year after age 4 years.  BRCA-related cancer risk assessment.** / For women who have family members with a BRCA-related cancer (breast, ovarian, tubal, or peritoneal cancers).  Mammogram.** / Every year beginning at age 40 years and continuing for as long as you are in good health. Consult with your health care provider.  Pap test.** / Every 3 years starting at age 9 years through age 34 or 91 years with 3 consecutive normal Pap tests. Testing can be stopped between 65 and 70 years with 3 consecutive normal Pap tests and no abnormal Pap or HPV tests in the past 10 years.  HPV screening.** / Every 3 years from ages 57 years through ages 64 or 45 years with a history of 3 consecutive normal Pap tests. Testing can be stopped between 65 and 70 years with 3 consecutive normal Pap tests and no abnormal Pap or HPV tests in the past 10 years.  Fecal occult blood test (FOBT) of stool. / Every year beginning at age 15 years and continuing until age 17 years. You may not need to do this test if you get a colonoscopy every 10 years.  Flexible sigmoidoscopy or colonoscopy.** / Every 5 years for a flexible sigmoidoscopy or every 10 years for a colonoscopy beginning at age 86 years and continuing until age 71 years.  Hepatitis C blood test.** / For all people born from [birth year] through 1966 and any individual with known risks for hepatitis C.  Osteoporosis screening.** / A one-time screening for women ages 83 years and over and women at risk for fractures or osteoporosis.  Skin self-exam. / Monthly.  Influenza vaccine. / Every year.  Tetanus, diphtheria, and acellular pertussis (Tdap/Td) vaccine.** / 1 dose of Td every 10 years.  Varicella vaccine.** / Consult your health care provider.  Zoster vaccine.** / 1 dose for adults aged 61 years or older.  Pneumococcal 13-valent conjugate (PCV13) vaccine.** / Consult your health care provider.  Pneumococcal  polysaccharide (PPSV23) vaccine.** / 1 dose for all adults aged 28 years and older.  Meningococcal vaccine.** / Consult your health care provider.  Hepatitis A vaccine.** / Consult your health care provider.  Hepatitis B vaccine.** / Consult your health care provider.  Haemophilus influenzae type b (Hib) vaccine.** / Consult your health care provider. ** Family history and personal history of risk and conditions may change your health care provider's recommendations. Document Released: 11/28/2001 Document Revised: 02/16/2014 Document Reviewed: 02/27/2011 [facility] Patient Information 2015 [place], [place]. This information is not intended to replace advice given to you by your health care provider. Make sure you discuss any questions you have with your health care provider.

## 2014-12-31 LAB — WET PREP, GENITAL
CLUE CELLS WET PREP: NONE SEEN
TRICH WET PREP: NONE SEEN
Yeast Wet Prep HPF POC: NONE SEEN

## 2014-12-31 LAB — HIV ANTIBODY (ROUTINE TESTING W REFLEX): HIV 1&2 Ab, 4th Generation: NONREACTIVE

## 2014-12-31 LAB — CYTOLOGY - PAP

## 2015-01-01 ENCOUNTER — Ambulatory Visit: Payer: Managed Care, Other (non HMO)

## 2015-01-01 ENCOUNTER — Telehealth: Payer: Self-pay | Admitting: *Deleted

## 2015-01-01 MED ORDER — FLUCONAZOLE 150 MG PO TABS: 150.0000 mg | ORAL_TABLET | Freq: Once | ORAL | Status: DC

## 2015-01-01 NOTE — Addendum Note (Signed)
Addended by: Jaynie CollinsANYANWU, UGONNA A on: 01/01/2015 07:59 AM   Modules accepted: Orders

## 2015-01-01 NOTE — Telephone Encounter (Signed)
-----   Message from Tereso NewcomerUgonna A Anyanwu, MD sent at 01/01/2015  7:58 AM EDT ----- Normal pap, negative Trich, GC and Chlam. Positive for yeast infection, Diflucan prescribed to pharmacy on file, please pick up prescription. Continue cervical cancer screening as recommended. Please call to inform patient of results and recommendations.

## 2015-01-01 NOTE — Telephone Encounter (Signed)
Called patient and left message to call us back.

## 2015-01-04 NOTE — Telephone Encounter (Signed)
Mychart message sent to patient.

## 2015-03-03 ENCOUNTER — Encounter: Payer: Self-pay | Admitting: *Deleted

## 2015-07-28 ENCOUNTER — Encounter: Payer: Self-pay | Admitting: Internal Medicine

## 2015-07-28 ENCOUNTER — Ambulatory Visit (INDEPENDENT_AMBULATORY_CARE_PROVIDER_SITE_OTHER): Payer: Managed Care, Other (non HMO) | Admitting: Internal Medicine

## 2015-07-28 VITALS — BP 139/89 | HR 73 | Temp 99.0°F | Ht 62.0 in | Wt 156.6 lb

## 2015-07-28 DIAGNOSIS — J309 Allergic rhinitis, unspecified: Secondary | ICD-10-CM

## 2015-07-28 DIAGNOSIS — N915 Oligomenorrhea, unspecified: Secondary | ICD-10-CM | POA: Diagnosis not present

## 2015-07-28 DIAGNOSIS — N926 Irregular menstruation, unspecified: Secondary | ICD-10-CM

## 2015-07-28 DIAGNOSIS — Z23 Encounter for immunization: Secondary | ICD-10-CM

## 2015-07-28 DIAGNOSIS — J4520 Mild intermittent asthma, uncomplicated: Secondary | ICD-10-CM | POA: Diagnosis not present

## 2015-07-28 DIAGNOSIS — Z7951 Long term (current) use of inhaled steroids: Secondary | ICD-10-CM

## 2015-07-28 DIAGNOSIS — Z0000 Encounter for general adult medical examination without abnormal findings: Secondary | ICD-10-CM

## 2015-07-28 MED ORDER — FLUTICASONE PROPIONATE 50 MCG/ACT NA SUSP: 2.0000 | Freq: Every day | NASAL | Status: DC

## 2015-07-28 MED ORDER — FLUTICASONE-SALMETEROL 250-50 MCG/DOSE IN AEPB: 1.0000 | INHALATION_SPRAY | Freq: Two times a day (BID) | RESPIRATORY_TRACT | Status: DC

## 2015-07-28 MED ORDER — FEXOFENADINE HCL 180 MG PO TABS: 180.0000 mg | ORAL_TABLET | Freq: Every day | ORAL | Status: DC

## 2015-07-28 MED ORDER — ALBUTEROL SULFATE HFA 108 (90 BASE) MCG/ACT IN AERS: 2.0000 | INHALATION_SPRAY | Freq: Four times a day (QID) | RESPIRATORY_TRACT | Status: DC | PRN

## 2015-07-28 NOTE — Progress Notes (Signed)
Patient ID: Sandra Oneill, female   DOB: Dec 08, 1985, 29 y.o.   MRN: [id number]   Subjective:   Patient ID: Sandra Oneill female   DOB: [birth year] 29 y.o.   MRN: [id number]  HPI: Sandra Oneill is a 29 y.o. F with a PMHx of asthma and atopic dermatitis presenting to the clinic for a follow-up visit. Please refer to problem based assessment and plan for the status of the patient's chronic medical conditions.     Past Medical History  Diagnosis Date  . Abnormal Pap smear     3-4 years ago  . Asthma    Current Outpatient Prescriptions  Medication Sig Dispense Refill  . albuterol (PROVENTIL HFA;VENTOLIN HFA) 108 (90 BASE) MCG/ACT inhaler Inhale 2 puffs into the lungs every 6 (six) hours as needed. For shortness of breath 1 Inhaler 6  . fexofenadine (ALLEGRA ALLERGY) 180 MG tablet Take 1 tablet (180 mg total) by mouth daily. 30 tablet 3  . fluconazole (DIFLUCAN) 150 MG tablet Take 1 tablet (150 mg total) by mouth once. Can take additional dose three days later if symptoms persist 1 tablet 3  . fluticasone (FLONASE) 50 MCG/ACT nasal spray Place 2 sprays into both nostrils daily. 16 g 3  . Fluticasone-Salmeterol (ADVAIR) 250-50 MCG/DOSE AEPB Inhale 1 puff into the lungs 2 (two) times daily. 1 each 6  . levonorgestrel-ethinyl estradiol (SEASONALE) 0.15-0.03 MG tablet Take 1 tablet by mouth daily. 1 Package 4  . medroxyPROGESTERone (DEPO-PROVERA) 150 MG/ML injection Inject 150 mg into the muscle every 3 (three) months.    . triamcinolone cream (KENALOG) 0.1 % Apply to affected area in a thin layer 1-2 times a day for total of 14 days 30 g 1   No current facility-administered medications for this visit.   Family History  Problem Relation Age of Onset  . Diabetes Maternal Grandmother   . Hypertension Mother    Social History   Social History  . Marital Status: Single    Spouse Name: N/A  . Number of Children: N/A  . Years of Education: N/A   Social History Main Topics  . Smoking  status: Former Smoker -- 0.10 packs/day    Types: Cigars    Quit date: 08/07/2011  . Smokeless tobacco: Never Used  . Alcohol Use: 0.0 oz/week    0 Standard drinks or equivalent per week     Comment: occasional  . Drug Use: No  . Sexual Activity: Yes    Birth Control/ Protection: Injection   Other Topics Concern  . None   Social History Narrative   Review of Systems: Review of Systems  Constitutional: Negative for fever and chills.  HENT: Positive for congestion. Negative for sore throat.        Sneezing   Eyes: Negative for blurred vision and redness.       Itchy, watery eyes   Respiratory: Positive for cough, shortness of breath and wheezing. Negative for sputum production.   Cardiovascular: Negative for chest pain and leg swelling.  Gastrointestinal: Negative for nausea, vomiting, abdominal pain, diarrhea and constipation.       Flatulence   Genitourinary: Negative for dysuria, urgency and frequency.       Amenorrhea   Musculoskeletal: Negative for myalgias and joint pain.  Skin: Negative for itching and rash.  Neurological: Negative for dizziness, sensory change, focal weakness and headaches.     Objective:  Physical Exam: Filed Vitals:   07/28/15 0851  BP: 139/89  Pulse: 73  Temp:  99 F (37.2 C)  TempSrc: Oral  Height: 5\' 2"  (1.575 m)  Weight: 156 lb 9.6 oz (71.033 kg)  SpO2: 99%   Physical Exam  Constitutional: She is oriented to person, place, and time. She appears well-developed and well-nourished. No distress.  HENT:  Head: Normocephalic and atraumatic.  Mouth/Throat: No oropharyngeal exudate.  Eyes: EOM are normal. Pupils are equal, round, and reactive to light. Right eye exhibits no discharge. Left eye exhibits no discharge.  Neck: Neck supple.  Cardiovascular: Normal rate, regular rhythm and intact distal pulses.   Pulmonary/Chest: Effort normal. No respiratory distress. She has no wheezes. She has no rales.  Abdominal: Soft. Bowel sounds are  normal. She exhibits no distension. There is no tenderness.  Musculoskeletal: She exhibits no edema.  Lymphadenopathy:    She has no cervical adenopathy.  Neurological: She is alert and oriented to person, place, and time.  Skin: Skin is warm and dry.    Assessment & Plan:

## 2015-07-28 NOTE — Patient Instructions (Addendum)
Ms. Victory DakinRiley it was nice meeting you today.  -Please use Advair twice daily for Asthma  -Continue using Albuterol inhaler as needed  -Take Fexofenadine 180 mg: 1 tablet daily for your allergies  -Continue using Flonase   -You have received your flu shot today.   -I have referred you to OB/GYN and you will be hearing from our office soon with an appointment date.  -Eat yogurt or take over-the-counter Probiotic supplementation for bloating.   -I am giving you a work excuse note today.   -Please return for a follow-up visit in 1 month. If your wheezing or shortness of breath become worse, please come into the clinic sooner.  -Have a wonderful day!

## 2015-07-29 NOTE — Assessment & Plan Note (Addendum)
Patient states she ran out of her Advair last week and since then her symptoms have been worse. States normally she uses her rescue inhaler maybe 1-2 times per week but since last week has been using it twice daily. She reports having wheezing and chest tightness at present. In addition, states she lives with her mom who is a smoker. On physical exam today, lungs were CTAB and no wheezing was noted. Worsening symptoms likely due to patient not taking Advair for 1 week.  -Advair 250-50: 1 puff BID refilled  -Albuterol rescue inhaler prn refilled  -Advised her to reduce her environmental exposure of cigarette smoke  -F/u visit in 4 weeks

## 2015-07-29 NOTE — Assessment & Plan Note (Signed)
Patient was concerned she has not had a period since March 2016. States she was previously on Depo-provera which was stopped in March. [place] but never had a period during that time. As per patient, she has not been sexually active in over 1 year. Also reports having nausea for several months but denies vomiting. Denies history of GERD or any other GI problems. No diarrhea or constipation. However, does report having flatulence. Abdominal examination unremarkable.  -OB/ GYN referral -Suggested she eat yogurt or use probiotic supplementation to help with the flatulence

## 2015-07-29 NOTE — Assessment & Plan Note (Signed)
Influenza vaccine administered today.

## 2015-07-29 NOTE — Assessment & Plan Note (Addendum)
Patient reports having chronic - dry cough, rhinorrhea, sneezing, itchy and watery eyes. Denies having any sore throat, fevers, or chills. She is currently taking Zyrtec for allergies but complaining of drowsiness.  -Stop Zyrtec -Start Fexofenadine 180 mg daily -Fluticasone nasal spray refilled today  -Reassess symptoms at f/u visit

## 2015-07-31 NOTE — Progress Notes (Signed)
Internal Medicine Clinic Attending  I saw and evaluated the patient.  I personally confirmed the key portions of the history and exam documented by Dr. Rathore and I reviewed pertinent patient test results.  The assessment, diagnosis, and plan were formulated together and I agree with the documentation in the resident's note.  

## 2015-08-05 ENCOUNTER — Encounter: Payer: Self-pay | Admitting: Obstetrics & Gynecology

## 2015-08-10 ENCOUNTER — Ambulatory Visit (INDEPENDENT_AMBULATORY_CARE_PROVIDER_SITE_OTHER): Payer: Managed Care, Other (non HMO) | Admitting: Internal Medicine

## 2015-08-10 VITALS — BP 122/72 | HR 94 | Temp 99.2°F | Resp 17 | Ht 63.5 in | Wt 156.0 lb

## 2015-08-10 DIAGNOSIS — Z111 Encounter for screening for respiratory tuberculosis: Secondary | ICD-10-CM | POA: Diagnosis not present

## 2015-08-10 DIAGNOSIS — Z029 Encounter for administrative examinations, unspecified: Secondary | ICD-10-CM

## 2015-08-10 NOTE — Progress Notes (Signed)
  Tuberculosis Risk Questionnaire  1. No Were you born outside the [place] in one of the following parts of the world: [place], [place], [place], [place] or [place]?    2. No Have you traveled outside the [place] and lived for more than one month in one of the following parts of the world: [place], [place], [place], [place] or [place]?    3. No Do you have a compromised immune system such as from any of the following conditions:HIV/AIDS, organ or bone marrow transplantation, diabetes, immunosuppressive medicines (e.g. Prednisone, Remicaide), leukemia, lymphoma, cancer of the head or neck, gastrectomy or jejunal bypass, end-stage renal disease (on dialysis), or silicosis?     4. Yes medical  Have you ever or do you plan on working in: a residential care center, a health care facility, a jail or prison or homeless shelter?     5. No Have you ever: injected illegal drugs, used crack cocaine, lived in a homeless shelter  or been in jail or prison?     6. No Have you ever been exposed to anyone with infectious tuberculosis?    Tuberculosis Symptom Questionnaire  Do you currently have any of the following symptoms?  1. No Unexplained cough lasting more than 3 weeks?   2. No Unexplained fever lasting more than 3 weeks.   3. No Night Sweats (sweating that leaves the bedclothes and sheets wet)     4. Yes asthma Shortness of Breath   5. No Chest Pain   6. No Unintentional weight loss    7. No Unexplained fatigue (very tired for no reason)

## 2015-08-10 NOTE — Progress Notes (Signed)
   Subjective:    Patient ID: Sandra Oneill, female    DOB: 1986/06/08, 29 y.o.   MRN: [id number] This chart was scribed for Ellamae Siaobert Satoria Dunlop, MD by Littie Deedsichard Sun, Medical Scribe. This patient was seen in Room 4 and the patient's care was started at 6:10 PM.   HPI HPI Comments: Sandra Oneill is a 29 y.o. female who presents to the [facility] for a TB test. 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 Her last TB test was in 2013, which was negative. Patient Active Problem List   Diagnosis Date Noted  . Allergic rhinitis 11/25/2014  . Urticaria 05/04/2014  . Palpitations 05/04/2014  . Irregular menstrual bleeding 02/02/2014  . Mass of left inguinal region 11/07/2013  . Vaginal discharge 11/07/2013  . Abdominal pain, left upper quadrant 11/27/2012  . Seasonal allergies 07/04/2012  . Fatigue 07/04/2012  . Preventative health care 05/27/2012  . Headache(784.0) 10/05/2011  . ASCUS PAP 01/15/2007  . Atopic dermatitis 11/21/2006  . Mild intermittent asthma in adult without complication 10/22/2006   descibes self as healthy at this point   Review of Systems negative    Objective:   Physical Exam  Constitutional: She is oriented to person, place, and time. She appears well-developed and well-nourished. No distress.  HENT:  Head: Normocephalic and atraumatic.  Right Ear: External ear normal.  Left Ear: External ear normal.  Mouth/Throat: Oropharynx is clear and moist.  Eyes: EOM are normal. Pupils are equal, round, and reactive to light.  Neck: Normal range of motion. Neck supple. No thyromegaly present.  Cardiovascular: Normal rate, regular rhythm and normal heart sounds.   No murmur heard. Pulmonary/Chest: Effort normal and breath sounds normal.  Abdominal: There is no tenderness.  Musculoskeletal: Normal range of motion. She exhibits no edema.  Neurological: She is alert and oriented to person, place, and time. She has normal  reflexes. No cranial nerve deficit.  Skin: Skin is warm and dry. No rash noted.  Psychiatric: She has a normal mood and affect. Her behavior is normal.  Nursing note and vitals reviewed.  BP 122/72 mmHg  Pulse 94  Temp(Src) 99.2 F (37.3 C) (Oral)  Resp 17  Ht 5' 3.5" (1.613 m)  Wt 156 lb (70.761 kg)  BMI 27.20 kg/m2  SpO2 97%  Tb skin test       Assessment & Plan:  I have completed the patient encounter in its entirety as documented by the scribe, with editing by me where necessary. Gwen Edler P. Merla Richesoolittle, M.D.  By signing my name below, I, Littie Deedsichard Sun, attest that this documentation has been prepared under the direction and in the presence of Ellamae Siaobert Boe Deans, MD.  Electronically Signed: Littie Deedsichard Sun, Medical Scribe. 08/10/2015. 6:10 PM.  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

## 2015-08-12 ENCOUNTER — Encounter (INDEPENDENT_AMBULATORY_CARE_PROVIDER_SITE_OTHER): Payer: Managed Care, Other (non HMO) | Admitting: *Deleted

## 2015-08-12 DIAGNOSIS — Z111 Encounter for screening for respiratory tuberculosis: Secondary | ICD-10-CM

## 2015-08-12 LAB — TB SKIN TEST: TB SKIN TEST: NEGATIVE

## 2015-08-25 ENCOUNTER — Encounter: Payer: Managed Care, Other (non HMO) | Admitting: Obstetrics & Gynecology

## 2015-08-25 NOTE — Addendum Note (Signed)
Addended by: Neomia DearPOWERS, Taylin Mans E on: 08/25/2015 05:51 PM   Modules accepted: Orders

## 2016-01-03 ENCOUNTER — Encounter: Payer: Self-pay | Admitting: Obstetrics & Gynecology

## 2016-01-03 ENCOUNTER — Ambulatory Visit (INDEPENDENT_AMBULATORY_CARE_PROVIDER_SITE_OTHER): Payer: BLUE CROSS/BLUE SHIELD | Admitting: Obstetrics & Gynecology

## 2016-01-03 VITALS — BP 129/75 | HR 76 | Temp 98.2°F | Wt 158.7 lb

## 2016-01-03 DIAGNOSIS — N912 Amenorrhea, unspecified: Secondary | ICD-10-CM | POA: Diagnosis not present

## 2016-01-03 NOTE — Progress Notes (Signed)
Patient ID: Sandra Oneill, female   DOB: 1986/05/16, 30 y.o.   MRN: [id number] History:  30 y.o. G0P0 here today for amenorrhea.  Pt was on OCPs Mar-Jun.  Was on them due to dysmenorrhea.  Pt was on Depo Provera for 1 1/2 years from 2015- Mar 2016.  Pt reports that she is not sexually active adn is not worried. She was told by her primary care doc to f/u with GYN.  She had normal cycles prior to the dep provera.        The following portions of the patient's history were reviewed and updated as appropriate: allergies, current medications, past family history, past medical history, past social history, past surgical history and problem list.  Review of Systems:  Pertinent items are noted in HPI.  Objective:  Physical Exam There were no vitals taken for this visit. Gen: NAD Exam deferred  Labs and Imaging No results found.  Assessment & Plan:  Amenorrhea due to Depo Provera  Pt will call for Provera challenge if no menses by June 2017   Ashawnti Tangen L. Harraway-Smith, M.D., Evern CoreFACOG

## 2016-01-03 NOTE — Patient Instructions (Signed)
Secondary Amenorrhea Secondary amenorrhea is the stopping of menstrual flow for 3-6 months in a female who has previously had periods. There are many possible causes. Most of these causes are not serious. Usually, treating the underlying problem causing the loss of menses will return your periods to normal. CAUSES  Some common and uncommon causes of not menstruating include:  Malnutrition.  Low blood sugar (hypoglycemia).  Polycystic ovary disease.  Stress or fear.  Breastfeeding.  Hormone imbalance.  Ovarian failure.  Medicines.  Extreme obesity.  Cystic fibrosis.  Low body weight or drastic weight reduction from any cause.  Early menopause.  Removal of ovaries or uterus.  Contraceptives.  Illness.  Long-term (chronic) illnesses.  Cushing syndrome.  Thyroid problems.  Birth control pills, patches, or vaginal rings for birth control. RISK FACTORS You may be at greater risk of secondary amenorrhea if:  You have a family history of this condition.  You have an eating disorder.  You do athletic training. DIAGNOSIS  A diagnosis is made by your health care provider taking a medical history and doing a physical exam. This will include a pelvic exam to check for problems with your reproductive organs. Pregnancy must be ruled out. Often, numerous blood tests are done to measure different hormones in the body. Urine testing may be done. Specialized exams (ultrasound, CT scan, MRI, or hysteroscopy) may have to be done as well as measuring the body mass index (BMI). TREATMENT  Treatment depends on the cause of the amenorrhea. If an eating disorder is present, this can be treated with an adequate diet and therapy. Chronic illnesses may improve with treatment of the illness. Amenorrhea may be corrected with medicines, lifestyle changes, or surgery. If the amenorrhea cannot be corrected, it is sometimes possible to create a false menstruation with medicines. HOME CARE  INSTRUCTIONS  Maintain a healthy diet.  Manage weight problems.  Exercise regularly but not excessively.  Get adequate sleep.  Manage stress.  Be aware of changes in your menstrual cycle. Keep a record of when your periods occur. Note the date your period starts, how long it lasts, and any problems. SEEK MEDICAL CARE IF: Your symptoms do not get better with treatment.   This information is not intended to replace advice given to you by your health care provider. Make sure you discuss any questions you have with your health care provider.   Document Released: 11/13/2006 Document Revised: 10/23/2014 Document Reviewed: 03/20/2013 Elsevier Interactive Patient Education 2016 Elsevier Inc.  

## 2016-04-12 ENCOUNTER — Other Ambulatory Visit: Payer: Self-pay

## 2016-04-12 MED ORDER — MEDROXYPROGESTERONE ACETATE 10 MG PO TABS: ORAL_TABLET | ORAL | Status: DC

## 2016-04-12 NOTE — Telephone Encounter (Signed)
Patient has not started her period. She was instructed to call us back if no period in by June 2017. Patient stated she would like to proceed with the Provera challenge. [facility]

## 2016-05-24 ENCOUNTER — Other Ambulatory Visit: Payer: Self-pay | Admitting: Internal Medicine

## 2016-05-24 DIAGNOSIS — J4520 Mild intermittent asthma, uncomplicated: Secondary | ICD-10-CM

## 2016-05-25 NOTE — Telephone Encounter (Signed)
Last seen 07/28/15

## 2016-07-07 ENCOUNTER — Telehealth: Payer: Self-pay

## 2016-07-07 NOTE — Telephone Encounter (Signed)
AP. REMINDER  CALL, LMTCB °

## 2016-07-10 ENCOUNTER — Ambulatory Visit (INDEPENDENT_AMBULATORY_CARE_PROVIDER_SITE_OTHER): Payer: BLUE CROSS/BLUE SHIELD | Admitting: Internal Medicine

## 2016-07-10 ENCOUNTER — Encounter: Payer: Self-pay | Admitting: Internal Medicine

## 2016-07-10 VITALS — BP 147/96 | HR 74 | Temp 98.9°F | Ht 62.0 in | Wt 163.5 lb

## 2016-07-10 DIAGNOSIS — M25551 Pain in right hip: Secondary | ICD-10-CM | POA: Diagnosis not present

## 2016-07-10 DIAGNOSIS — M5417 Radiculopathy, lumbosacral region: Secondary | ICD-10-CM

## 2016-07-10 DIAGNOSIS — M545 Low back pain: Secondary | ICD-10-CM | POA: Diagnosis not present

## 2016-07-10 DIAGNOSIS — Z0000 Encounter for general adult medical examination without abnormal findings: Secondary | ICD-10-CM

## 2016-07-10 DIAGNOSIS — J309 Allergic rhinitis, unspecified: Secondary | ICD-10-CM

## 2016-07-10 DIAGNOSIS — J4520 Mild intermittent asthma, uncomplicated: Secondary | ICD-10-CM | POA: Diagnosis not present

## 2016-07-10 DIAGNOSIS — Z23 Encounter for immunization: Secondary | ICD-10-CM | POA: Diagnosis not present

## 2016-07-10 MED ORDER — ALBUTEROL SULFATE HFA 108 (90 BASE) MCG/ACT IN AERS: INHALATION_SPRAY | RESPIRATORY_TRACT | 6 refills | Status: DC

## 2016-07-10 MED ORDER — FLUTICASONE-SALMETEROL 250-50 MCG/DOSE IN AEPB: 1.0000 | INHALATION_SPRAY | Freq: Two times a day (BID) | RESPIRATORY_TRACT | 6 refills | Status: DC

## 2016-07-10 MED ORDER — NAPROXEN 500 MG PO TABS: 500.0000 mg | ORAL_TABLET | Freq: Two times a day (BID) | ORAL | 0 refills | Status: DC

## 2016-07-10 MED ORDER — FLUTICASONE PROPIONATE 50 MCG/ACT NA SUSP: 2.0000 | Freq: Every day | NASAL | 3 refills | Status: DC

## 2016-07-10 MED ORDER — FEXOFENADINE HCL 180 MG PO TABS: 180.0000 mg | ORAL_TABLET | Freq: Every day | ORAL | 3 refills | Status: DC

## 2016-07-10 NOTE — Assessment & Plan Note (Signed)
Flu vaccine given today. 

## 2016-07-10 NOTE — Assessment & Plan Note (Signed)
-  Fexofenadine 180 mg daily refilled today -Fluticasone nasal spray refilled today

## 2016-07-10 NOTE — Patient Instructions (Signed)
Thank you for coming to see me today. It was a pleasure. Today we talked about:   Hip and Back Pain: - try rest, ice, and heat as tolerated.  I have also given you a prescription for Naproxen 500mg  twice per day for 2 weeks.  - We will also send you to physical therapy.  Please follow-up with us in 4 weeks as needed or sooner if needed.  If you have any questions or concerns, please do not hesitate to call the office at [phone].  Take Care,   Gwynn BurlyAndrew Chea Malan, DO

## 2016-07-10 NOTE — Assessment & Plan Note (Signed)
A: Suspect MSK etiology as her symptoms appear most consistent with lumbosacral radiculopathy.  Will plan to treat conservatively at this point and hold off on imaging unless symptoms fail to improve or worsen.    P: - short course Naproxen 500mg  BID x 2 weeks with no refills - recommended alternating with heat and ice - referral to physical therapy - RTC 4 weeks if not improving to consider imaging.

## 2016-07-10 NOTE — Assessment & Plan Note (Signed)
-  Advair 250-50: 1 puff BID refilled  -Albuterol rescue inhaler prn refilled

## 2016-07-10 NOTE — Progress Notes (Signed)
   CC: here for low back pain  HPI:  Sandra Oneill is a 30 y.o. woman who is here today for low back and right hip pain.  Symptoms began about 1 year ago and have gradually worsened.  Describes as a achy pain with occasional sharp, shooting pain down her right leg.  This happens about once weekly.  She states pain aggravated by raising her leg straight off the bed or with standing.  Also worse with bending over and picking up objects.  She occasionally takes Advil which provides some relief.  She bought Epsom salt baths but that has not helped.  She cannot think of any other relieving factors.  She denies incontinence, loss of muscle strength, or saddle anesthesia.  She denies trauma or falls.  She denies fevers.  Past Medical History:  Diagnosis Date  . Abnormal Pap smear    3-4 years ago  . Asthma     Review of Systems:   Please see pertinent ROS reviewed in HPI and problem based charting.   Physical Exam:  Vitals:   07/10/16 1358  BP: (!) 147/96  Pulse: 74  Temp: 98.9 F (37.2 C)  TempSrc: Oral  SpO2: 100%  Weight: 163 lb 8 oz (74.2 kg)  Height: 5\' 2"  (1.575 m)   Physical Exam  Constitutional: She is oriented to person, place, and time and well-developed, well-nourished, and in no distress.  HENT:  Head: Normocephalic and atraumatic.  Pulmonary/Chest: Effort normal.  Musculoskeletal:  No tenderness over her lumbar spine or paraspinal musculature.   Normal 5/5 strength with lower extremity strength testing. Straight leg raise on the right elicited symptoms.  Neurological: She is alert and oriented to person, place, and time.  Skin: Skin is warm and dry.  Psychiatric: Mood and affect normal.     Assessment & Plan:   See Encounters Tab for problem based charting.  Patient discussed with Dr. Rogelia BogaButcher.   Preventative health care Flu vaccine given today.  Lumbosacral radiculopathy A: Suspect MSK etiology as her symptoms appear most consistent with lumbosacral  radiculopathy.  Will plan to treat conservatively at this point and hold off on imaging unless symptoms fail to improve or worsen.    P: - short course Naproxen 500mg  BID x 2 weeks with no refills - recommended alternating with heat and ice - referral to physical therapy - RTC 4 weeks if not improving to consider imaging.  Mild intermittent asthma in adult without complication -Advair 250-50: 1 puff BID refilled  -Albuterol rescue inhaler prn refilled  Allergic rhinitis -Fexofenadine 180 mg daily refilled today -Fluticasone nasal spray refilled today

## 2016-07-12 NOTE — Progress Notes (Signed)
Internal Medicine Clinic Attending  Case discussed with Dr. Wallace soon after the resident saw the patient.  We reviewed the resident's history and exam and pertinent patient test results.  I agree with the assessment, diagnosis, and plan of care documented in the resident's note. 

## 2016-07-18 ENCOUNTER — Ambulatory Visit (INDEPENDENT_AMBULATORY_CARE_PROVIDER_SITE_OTHER): Payer: BLUE CROSS/BLUE SHIELD | Admitting: Family Medicine

## 2016-07-18 VITALS — BP 120/84 | HR 84 | Temp 99.2°F | Resp 16 | Ht 63.0 in | Wt 165.8 lb

## 2016-07-18 DIAGNOSIS — R1032 Left lower quadrant pain: Secondary | ICD-10-CM | POA: Insufficient documentation

## 2016-07-18 DIAGNOSIS — K5901 Slow transit constipation: Secondary | ICD-10-CM | POA: Diagnosis not present

## 2016-07-18 DIAGNOSIS — N912 Amenorrhea, unspecified: Secondary | ICD-10-CM | POA: Insufficient documentation

## 2016-07-18 DIAGNOSIS — Z113 Encounter for screening for infections with a predominantly sexual mode of transmission: Secondary | ICD-10-CM | POA: Diagnosis not present

## 2016-07-18 DIAGNOSIS — Z114 Encounter for screening for human immunodeficiency virus [HIV]: Secondary | ICD-10-CM | POA: Diagnosis not present

## 2016-07-18 LAB — POCT URINE PREGNANCY: Preg Test, Ur: NEGATIVE

## 2016-07-18 MED ORDER — POLYETHYLENE GLYCOL 3350 17 GM/SCOOP PO POWD: 17.0000 g | Freq: Every day | ORAL | 1 refills | Status: DC | PRN

## 2016-07-18 MED ORDER — DOCUSATE SODIUM 100 MG PO CAPS: 100.0000 mg | ORAL_CAPSULE | Freq: Two times a day (BID) | ORAL | 1 refills | Status: DC

## 2016-07-18 NOTE — Assessment & Plan Note (Signed)
Pregnancy test negative.  She has a GYN that she would like to follow up with for this.  STD testing performed today as well.

## 2016-07-18 NOTE — Assessment & Plan Note (Signed)
STD screening because of unprotected intercourse and lower abdominal pain.  Will call with abnormal results.

## 2016-07-18 NOTE — Progress Notes (Signed)
Subjective:    Patient ID: Sandra Oneill, female    DOB: 1985-12-27, 30 y.o.   MRN: [id number]  HPI Presents with abdominal pain that has been ongoing since Friday. She had started taking naproxen for hip and back pain and then started having severe abdominal pain afterwards.  She denies changes in her bowel, but does report constipation.  Denies melena or hematochezia.  She denies fevers, chills.  It is in her LLQ.  Denies nausea or vomiting.  Does not get better or worse with food intake.  She has BM's about every 3-4 days.   She reports one episode of unprotected intercourse a few months ago and has had a LMP of 02/23/16.  She has taken several pregnancy tests with negative results.  Denies vaginal discharge, vaginal bleeding, dysuria.    Review of Systems  Constitutional: Negative for chills, fatigue and fever.  HENT: Negative for congestion and rhinorrhea.   Respiratory: Negative for cough, chest tightness and shortness of breath.   Cardiovascular: Negative for chest pain and leg swelling.  Gastrointestinal: Positive for abdominal pain and constipation. Negative for abdominal distention, anal bleeding, blood in stool, diarrhea, nausea, rectal pain and vomiting.  Genitourinary: Positive for menstrual problem. Negative for difficulty urinating, dysuria, flank pain, urgency, vaginal bleeding, vaginal discharge and vaginal pain.  Musculoskeletal: Negative for arthralgias and joint swelling.  Skin: Negative for rash and wound.  Psychiatric/Behavioral: Negative for agitation and confusion.  All other systems reviewed and are negative.      Objective:   Physical Exam  Constitutional: She is oriented to person, place, and time. She appears well-developed and well-nourished. No distress.  HENT:  Head: Normocephalic and atraumatic.  Right Ear: External ear normal.  Left Ear: External ear normal.  Neck: Normal range of motion. Neck supple.  Pulmonary/Chest: Effort normal. No respiratory  distress.  Abdominal: Soft. She exhibits no distension and no mass. There is tenderness. There is no rebound and no guarding.  Has ttp in LLQ without rebound or guarding. Mildly hypoactive bowel sounds. Deferred rectal exam.  Genitourinary: Vagina normal and uterus normal. No vaginal discharge found.  Genitourinary Comments: Bimanual without pain, only pressure. No CMT  Musculoskeletal: Normal range of motion. She exhibits no edema.  Neurological: She is alert and oriented to person, place, and time.  Skin: Skin is warm. No rash noted. She is not diaphoretic. No erythema.  Psychiatric: She has a normal mood and affect. Her behavior is normal. Judgment and thought content normal.  Nursing note and vitals reviewed.   BP 120/84   Pulse 84   Temp 99.2 F (37.3 C) (Oral)   Resp 16   Ht 5\' 3"  (1.6 m)   Wt 165 lb 12.8 oz (75.2 kg)   LMP 02/23/2016   SpO2 99%   BMI 29.37 kg/m        Assessment & Plan:  Slow transit constipation Will treat with colace and miralax.  Gave dietary recommendations.  Abdominal pain, left lower quadrant Has a history of constipation, has been taking NSAIDs.  Has stopped NSAIDs and treating for constipation.  Urine preg neg.  STD screening performed.  Bimanual exam without abnormality.  May be of GYN etiology with amenorrhea present as well.  Recommend follow up with her GYN.    Screen for STD (sexually transmitted disease) STD screening because of unprotected intercourse and lower abdominal pain.  Will call with abnormal results.  Amenorrhea Pregnancy test negative.  She has a GYN that she would  like to follow up with for this.  STD testing performed today as well.    Signed,  Corliss Marcus, DO [facility] 6:51 PM  07/18/16

## 2016-07-18 NOTE — Assessment & Plan Note (Signed)
Has a history of constipation, has been taking NSAIDs.  Has stopped NSAIDs and treating for constipation.  Urine preg neg.  STD screening performed.  Bimanual exam without abnormality.  May be of GYN etiology with amenorrhea present as well.  Recommend follow up with her GYN.

## 2016-07-18 NOTE — Patient Instructions (Addendum)
Please see your gynecologist for missing periods.  Follow up if not improved with constipation medications.   IF you received an x-ray today, you will receive an invoice from [facility]. Please contact [facility] at [phone] with questions or concerns regarding your invoice.   IF you received labwork today, you will receive an invoice from [facility]. Please contact [facility] at [phone] with questions or concerns regarding your invoice.   Our billing staff will not be able to assist you with questions regarding bills from these companies.  You will be contacted with the lab results as soon as they are available. The fastest way to get your results is to activate your My Chart account. Instructions are located on the last page of this paperwork. If you have not heard from us regarding the results in 2 weeks, please contact this office.

## 2016-07-18 NOTE — Assessment & Plan Note (Signed)
Will treat with colace and miralax.  Gave dietary recommendations.

## 2016-07-19 LAB — HIV ANTIBODY (ROUTINE TESTING W REFLEX): HIV: NONREACTIVE

## 2016-07-19 NOTE — Progress Notes (Signed)
Patient discussed with Dr. Zachery DauerBarnes. Agree with findings, assessment and plan of care per her note.  Signed,   Meredith StaggersJeffrey Shevette Bess, MD [facility].  07/19/16 1:10 PM

## 2016-07-20 LAB — RPR

## 2016-07-20 LAB — GC/CHLAMYDIA PROBE AMP
CT Probe RNA: NOT DETECTED
GC PROBE AMP APTIMA: NOT DETECTED

## 2016-07-25 ENCOUNTER — Ambulatory Visit: Payer: BLUE CROSS/BLUE SHIELD | Attending: Orthopedic Surgery | Admitting: Physical Therapy

## 2016-07-25 ENCOUNTER — Encounter: Payer: Self-pay | Admitting: Student

## 2016-07-25 ENCOUNTER — Telehealth: Payer: Self-pay | Admitting: *Deleted

## 2016-07-25 DIAGNOSIS — M25651 Stiffness of right hip, not elsewhere classified: Secondary | ICD-10-CM

## 2016-07-25 DIAGNOSIS — M62830 Muscle spasm of back: Secondary | ICD-10-CM | POA: Diagnosis present

## 2016-07-25 DIAGNOSIS — M5441 Lumbago with sciatica, right side: Secondary | ICD-10-CM

## 2016-07-25 DIAGNOSIS — R262 Difficulty in walking, not elsewhere classified: Secondary | ICD-10-CM

## 2016-07-25 NOTE — Telephone Encounter (Signed)
Noted. Thank you for letting her know. Will let Dr. Zachery DauerBarnes know as an FYI.

## 2016-07-25 NOTE — Telephone Encounter (Signed)
Patient called about lab results and wanted to know results.  Advised patient that all tests were negative.

## 2016-07-26 NOTE — Therapy (Addendum)
[place], [place], [address] Phone: [phone]   Fax:  [phone]  Physical Therapy Evaluation  Patient Details  Name: Sandra Oneill MRN: [id number] Date of Birth: 05-08-1986 Referring Provider: Jule Ser DO   Encounter Date: 07/25/2016      PT End of Session - 07/25/16 2016    Visit Number 1   Number of Visits 16   Date for PT Re-Evaluation 09/19/16   PT Start Time 0805   PT Stop Time 0845   PT Time Calculation (min) 40 min   Activity Tolerance Patient tolerated treatment well   Behavior During Therapy [facility] for tasks assessed/performed      Past Medical History:  Diagnosis Date  . Abnormal Pap smear    3-4 years ago  . Asthma     Past Surgical History:  Procedure Laterality Date  . WISDOM TOOTH EXTRACTION  12/15 and 2/16   2 teeth    There were no vitals filed for this visit.       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 She sits at work on the ground with children. She feels more pain when she goes to stand up. She has pain at night as well. she recently went to the MD who gave her colase 2nd to slow bowel sounds. Her pain reached a level on 07/18/2016 wer she had to go to the MD.    Limitations Sitting;Standing;House hold activities;Other (comment)  Work activity    How long can you sit comfortably? <10 minutes    How long can you stand comfortably? Standing < 20 minutes    How long can you walk comfortably? Pain when walking at the store. Used to walk for exercise but had to stop    Diagnostic tests Nothing at this time    Patient Stated Goals Less pain at work.    Pain Score 8    Pain Location Back   Pain Orientation Right   Pain Descriptors / Indicators Aching;Throbbing;Shooting   Pain Type Acute pain   Pain Radiating Towards radiates into the right leg    Pain Onset 1 to 4 weeks ago   Pain  Frequency Constant   Aggravating Factors  sitting, standing, walking    Pain Relieving Factors rest, ice    Effect of Pain on Daily Activities pain at work, difficulty sleeping    Multiple Pain Sites No            [facility] PT Assessment - 07/26/16 0001      Assessment   Medical Diagnosis Low back pain    Referring Provider Jule Ser DO    Onset Date/Surgical Date --  > 1 year    Hand Dominance Right   Next MD Visit --  1 month per patient    Prior Therapy No     Precautions   Precautions None     Restrictions   Weight Bearing Restrictions No     Home Environment   Living Environment Private residence     Prior Function   Level of Independence Independent     Cognition   Overall Cognitive Status Within Functional Limits for tasks assessed   Attention Focused   Focused Attention Appears intact   Memory Appears intact   Awareness Appears intact   Problem Solving Appears intact     Observation/Other Assessments   Observations Mild obesity  Focus on Therapeutic Outcomes (FOTO)  59% limitation      Sensation   Light Touch Appears Intact   Additional Comments Pain into the right quad area      Coordination   Gross Motor Movements are Fluid and Coordinated Yes     Posture/Postural Control   Posture Comments Hyperlordosis, forward head, rounded shoulders;      AROM   Lumbar Flexion 25% limited minor pain    Lumbar Extension 75% limited with significant pain    Lumbar - Right Side Bend 25% limited mild pain    Lumbar - Left Side Bend 25% limited    Lumbar - Right Rotation 25% limited with pain    Lumbar - Left Rotation 50% limited with pain      PROM   Overall PROM Comments Pain with right hip flexion and internal rotation      Strength   Right Hip Flexion 3+/5   Right Hip Extension 3+/5   Right Hip ABduction 4/5   Left Hip Flexion 4+/5   Left Hip Extension 4/5   Left Hip ABduction 4+/5   Left Hip ADduction 5/5   Right Knee Flexion 4+/5   Right  Knee Extension 4+/5   Left Knee Flexion 5/5   Left Knee Extension 5/5     Flexibility   Soft Tissue Assessment /Muscle Length yes   Hamstrings right 20 degrees from straight      Palpation   Palpation comment Spasming in right lumbar paraspinals. Spasming into right upper glut     Special Tests    Special Tests --  SLR (+) on the right      Ambulation/Gait   Gait Comments Decreased hip flexion with gait                            PT Education - 07/25/16 2015    Education provided Yes   Education Details Patient given HEP. patient educated on the importance of symptom mangement and improving hip mobility.    Person(s) Educated Patient   Methods Explanation;Tactile cues;Handout;Verbal cues   Comprehension Verbalized understanding;Returned demonstration          PT Short Term Goals - 07/26/16 0742      PT SHORT TERM GOAL #1   Title -Patient will demonstrate a good core contraction   Time 4   Period Weeks   Status New     PT SHORT TERM GOAL #2   Title -Pt will be I w/ HEP for core strength and stretching    Time 4   Period Weeks   Status New     PT SHORT TERM GOAL #3   Title -Pt will report centralized lumbar pain with no radiculopathy   Time 4   Period Weeks   Status New     PT SHORT TERM GOAL #4   Title Patient will demostrate 5/5 gross bilateral lower extremity strength    Time 4   Period Weeks   Status New           PT Long Term Goals - 07/26/16 0745      PT LONG TERM GOAL #1   Title Patient will sit on the floor at work with no increased pain    Time 8   Period Weeks   Status New     PT LONG TERM GOAL #2   Title Patient will stand fro 1 hour without increased pain in order to  perform ADL's    Time 8   Period Weeks   Status New     PT LONG TERM GOAL #3   Title Patient will bend to pick up 20lb object with good technique    Time 8   Period Weeks   Status New     PT LONG TERM GOAL #4   Title Patient will demsotrate a  36% limitation on FOTO    Time 8   Period Weeks   Status New               Plan - 07/26/16 0739    Clinical Impression Statement Patient is a 30 year old female. She presents with lower back pain and pain into her right buttock. She has pain with exetnsion. She is hyperlordodic. Her signs and symptoms are consisitent with piriformis syndorme and likely spondylosis. She sits [place] style at work on the floor and long sits. She has tightness in pain  with a right hamstring stretch. She also has pain with right hip flexion over 90 degrees which likely effects her ability to sit at work. She would benefit from skilled therapy to improve core and hip strength, hip mobility, and ability to perfrom functional tasks at work. She was seen for a low complexity evaluation. She would also benefit from lifting technique for education.    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 Patient long sits at work and has pain with hamstring stretch. She is is likely incresing her hyperlordosis.     PT Home Exercise Plan hamstring stretch, piriformis stretch, single knee to chest stretch, lateral trunk rotation    Consulted and Agree with Plan of Care Patient      Patient will benefit from skilled therapeutic intervention in order to improve the following deficits and impairments:  Abnormal gait, Decreased activity tolerance, Decreased strength, Decreased mobility, Decreased safety awareness, Pain, Postural dysfunction, Increased muscle spasms, Decreased range of motion, Increased fascial restricitons  Visit Diagnosis: Acute right-sided low back pain with  right-sided sciatica - Plan: PT plan of care cert/re-cert  Muscle spasm of back - Plan: PT plan of care cert/re-cert  Stiffness of right hip, not elsewhere classified - Plan: PT plan of care cert/re-cert  Difficulty in walking, not elsewhere classified - Plan: PT plan of care cert/re-cert     Problem List Patient Active Problem List   Diagnosis Date Noted  . Amenorrhea 07/18/2016  . Abdominal pain, left lower quadrant 07/18/2016  . Slow transit constipation 07/18/2016  . Screen for STD (sexually transmitted disease) 07/18/2016  . Lumbosacral radiculopathy 07/10/2016  . Allergic rhinitis 11/25/2014  . Urticaria 05/04/2014  . Palpitations 05/04/2014  . Irregular menstrual bleeding 02/02/2014  . Mass of left inguinal region 11/07/2013  . Vaginal discharge 11/07/2013  . Abdominal pain, left upper quadrant 11/27/2012  . Seasonal allergies 07/04/2012  . Fatigue 07/04/2012  . Preventative health care 05/27/2012  . Headache(784.0) 10/05/2011  . ASCUS PAP 01/15/2007  . Atopic dermatitis 11/21/2006  . Mild intermittent asthma in adult without complication 10/22/2006   PHYSICAL THERAPY DISCHARGE SUMMARY  Visits from Start of Care: 1  Current  functional level related to goals / functional outcomes: Only came for 1 visit    Remaining deficits: Unknown    Education / Equipment: Unknown  Plan: Patient agrees to discharge.  Patient goals were not met. Patient is being discharged due to not returning since the last visit.  ?????     Carney Living PT DPT  07/26/2016, 8:06 AM  [facility] [address] [place], [place], [address] Phone: [phone]   Fax:  [phone]  Name: Sandra Oneill MRN: [id number] Date of Birth: [birth year]

## 2016-08-14 ENCOUNTER — Ambulatory Visit: Payer: BLUE CROSS/BLUE SHIELD | Admitting: Obstetrics & Gynecology

## 2017-04-25 ENCOUNTER — Ambulatory Visit (INDEPENDENT_AMBULATORY_CARE_PROVIDER_SITE_OTHER): Payer: BLUE CROSS/BLUE SHIELD | Admitting: Internal Medicine

## 2017-04-25 ENCOUNTER — Ambulatory Visit (HOSPITAL_COMMUNITY)
Admission: RE | Admit: 2017-04-25 | Discharge: 2017-04-25 | Disposition: A | Discharge status: Home / Self Care | Payer: BLUE CROSS/BLUE SHIELD | Source: Ambulatory Visit | Attending: Oncology | Admitting: Oncology

## 2017-04-25 ENCOUNTER — Encounter: Payer: Self-pay | Admitting: Internal Medicine

## 2017-04-25 DIAGNOSIS — M5417 Radiculopathy, lumbosacral region: Secondary | ICD-10-CM

## 2017-04-25 DIAGNOSIS — Z791 Long term (current) use of non-steroidal anti-inflammatories (NSAID): Secondary | ICD-10-CM

## 2017-04-25 MED ORDER — NAPROXEN 500 MG PO TABS: 500.0000 mg | ORAL_TABLET | Freq: Two times a day (BID) | ORAL | 0 refills | Status: DC

## 2017-04-25 NOTE — Assessment & Plan Note (Addendum)
Patient presents with 1 year of pain in her right back and hip. The pain is throbbing over her right lower back and radiates through her hip down her right thigh. It is worse when she leans over or is lying in bed. Is associated with numbness in her right thigh which develops when she sits for an extended period of time. She suddenly began trying stretching, Aleve and naproxen, these helped to relieve the pain. She was seen for this pain in September of last year and was referred to physical therapy, she completed one of these sessions and learned the exercises. She denies incontinence, saddle anesthesia, or trauma to her back.  I believe she is developed sciatica which may be related to piriformis syndrome. She request imaging of her lower back today, she is concerned about how long she has been having this pain.   - Refilled naproxen 500 mg twice a day #30 one tablet -Provided recommendations for piriformis stretching - ordered lumbar spine xrays  -Return to clinic in 1 month if the pain is not improved, at that time can consider referral to sports medicine for piriformis steroid injection  ADDENDUM: lumbar spinal xray shows no acute fracture or malalignment, I have called and communicated this to the patient, we will continue with the plan for conservative management and monitoring of her back pain.

## 2017-04-25 NOTE — Patient Instructions (Signed)
Sandra Oneill  It was a pleasure to meet you today  I think your pain may be related to sciatica,  The best thing for this is a combination of heat therapy, NSAIDs, and stretching exercises. Your pain should improve gradually with these.   If your pain is not better in the next 4-6 weeks please schedule a follow-up appointment in the clinic If your pain improves please schedule a regular checkup with your primary care doctor in 2 months   Piriformis Syndrome Rehab Ask your health care provider which exercises are safe for you. Do exercises exactly as told by your health care provider and adjust them as directed. It is normal to feel mild stretching, pulling, tightness, or discomfort as you do these exercises, but you should stop right away if you feel sudden pain or your pain gets worse.Do not begin these exercises until told by your health care provider. Stretching and range of motion exercises These exercises warm up your muscles and joints and improve the movement and flexibility of your hip and pelvis. These exercises also help to relieve pain, numbness, and tingling. Exercise A: Hip rotators  1. Lie on your back on a firm surface. 2. Pull your left / right knee toward your same shoulder with your left / right hand until your knee is pointing toward the ceiling. Hold your left / right ankle with your other hand. 3. Keeping your knee steady, gently pull your left / right ankle toward your other shoulder until you feel a stretch in your buttocks. 4. Hold this position for __________ seconds. Repeat __________ times. Complete this stretch __________ times a day. Exercise B: Hip extensors 1. Lie on your back on a firm surface. Both of your legs should be straight. 2. Pull your left / right knee to your chest. Hold your leg in this position by holding onto the back of your thigh or the front of your knee. 3. Hold this position for __________ seconds. 4. Slowly return to the starting  position. Repeat __________ times. Complete this stretch __________ times a day. Strengthening exercises These exercises build strength and endurance in your hip and thigh muscles. Endurance is the ability to use your muscles for a long time, even after they get tired. Exercise C: Straight leg raises ( hip abductors) 1. Lie on your side with your left / right leg in the top position. Lie so your head, shoulder, knee, and hip line up. Bend your bottom knee to help you balance. 2. Lift your top leg up 4-6 inches (10-15 cm), keeping your toes pointed straight ahead. 3. Hold this position for __________ seconds. 4. Slowly lower your leg to the starting position. Let your muscles relax completely. Repeat __________ times. Complete this exercise__________ times a day. Exercise D: Hip abductors and rotators, quadruped  1. Get on your hands and knees on a firm, lightly padded surface. Your hands should be directly below your shoulders, and your knees should be directly below your hips. 2. Lift your left / right knee out to the side. Keep your knee bent. Do not twist your body. 3. Hold this position for __________ seconds. 4. Slowly lower your leg. Repeat __________ times. Complete this exercise__________ times a day. Exercise E: Straight leg raises ( hip extensors) 1. Lie on your abdomen on a bed or a firm surface with a pillow under your hips. 2. Squeeze your buttock muscles and lift your left / right thigh off the bed. Do not let your back arch.  3. Hold this position for __________ seconds. 4. Slowly return to the starting position. Let your muscles relax completely before doing another repetition. Repeat __________ times. Complete this exercise__________ times a day. This information is not intended to replace advice given to you by your health care provider. Make sure you discuss any questions you have with your health care provider. Document Released: 10/02/2005 Document Revised: 06/06/2016  Document Reviewed: 09/14/2015 Elsevier Interactive Patient Education  [employer]2018 [employer]

## 2017-04-25 NOTE — Progress Notes (Addendum)
   CC: Back pain  HPI:  Sandra Oneill is a 31 y.o. past medical history asthma and seasonal allergies who presents for follow-up of lower back pain.  Past Medical History:  Diagnosis Date  . Abnormal Pap smear    3-4 years ago  . Asthma    Review of Systems:  Please refer to history of present illness and assessment and plans For pertinent review of systems, all others reviewed and negative  Physical Exam:  Vitals:   04/25/17 1430  BP: 140/87  Pulse: 87  Temp: 98.3 F (36.8 C)  TempSrc: Oral  SpO2: 100%  Weight: 171 lb 3.2 oz (77.7 kg)  Height: 5\' 3"  (1.6 m)   Physical Exam  Constitutional: She is oriented to person, place, and time and well-developed, well-nourished, and in no distress. No distress.  HENT:  Head: Normocephalic and atraumatic.  Eyes: Conjunctivae are normal. Right eye exhibits no discharge. Left eye exhibits no discharge.  Neck: Normal range of motion.  Cardiovascular:  No lower extremity edema  Musculoskeletal: Normal range of motion. She exhibits no edema.  Tenderness to palpation over right lumbar paraspinal muscles and over right piriformis. 5/5 strength with hip flexion, knee flexion and extension. Pain in right lateral hip with flexion. Straight leg raise is positive in the right leg. No sensory deficits.  Neurological: She is alert and oriented to person, place, and time.  Skin: Skin is warm and dry. She is not diaphoretic.  Psychiatric: Affect and judgment normal.   Assessment & Plan:   Lumbosacral radiculopathy Patient presents with 1 year of pain in her right back and hip. The pain is throbbing over her right lower back and radiates through her hip down her right thigh. It is worse when she leans over or is lying in bed. Is associated with numbness in her right thigh which develops when she sits for an extended period of time. She suddenly began trying stretching, Aleve and naproxen, these helped to relieve the pain. She was seen for this  pain in September of last year and was referred to physical therapy, she completed one of these sessions and learned the exercises. She denies incontinence, saddle anesthesia, or trauma to her back.  I believe she is developed sciatica which may be related to piriformis syndrome. She request imaging of her lower back today, she is concerned about how long she has been having this pain.   - Refilled naproxen 500 mg twice a day #30 one tablet -Provided recommendations for piriformis stretching - ordered lumbar spine xrays  -Return to clinic in 1 month if the pain is not improved, at that time can consider referral to sports medicine for piriformis steroid injection  ADDENDUM: lumbar spinal xray shows no acute fracture or malalignment, I have called and communicated this to the patient, we will continue with the plan for conservative management and monitoring of her back pain.   See Encounters Tab for problem based charting.  Patient discussed with Dr. Criselda PeachesMullen

## 2017-04-30 MED ORDER — NAPROXEN 500 MG PO TABS: 500.0000 mg | ORAL_TABLET | Freq: Two times a day (BID) | ORAL | 0 refills | Status: DC

## 2017-04-30 NOTE — Progress Notes (Signed)
Internal Medicine Clinic Attending  Case discussed with Dr. Blum at the time of the visit.  We reviewed the resident's history and exam and pertinent patient test results.  I agree with the assessment, diagnosis, and plan of care documented in the resident's note. 

## 2017-04-30 NOTE — Addendum Note (Signed)
Addended by: Earl LagosBLUM, Siani Utke S on: 04/30/2017 05:39 PM   Modules accepted: Orders

## 2017-05-01 MED ORDER — NAPROXEN 500 MG PO TABS: 500.0000 mg | ORAL_TABLET | Freq: Two times a day (BID) | ORAL | 0 refills | Status: DC

## 2017-05-01 NOTE — Addendum Note (Signed)
Addended by: Earl LagosBLUM, Marita Burnsed S on: 05/01/2017 09:17 AM   Modules accepted: Orders

## 2017-07-25 IMAGING — DX DG LUMBAR SPINE COMPLETE 4+V
5 series · 5 of 5 positions shown · non-contrast
Comparison: None.

CLINICAL DATA: Low back pain.

EXAM:
LUMBAR SPINE - COMPLETE 4+ VIEW

[t lumbar spine ap]
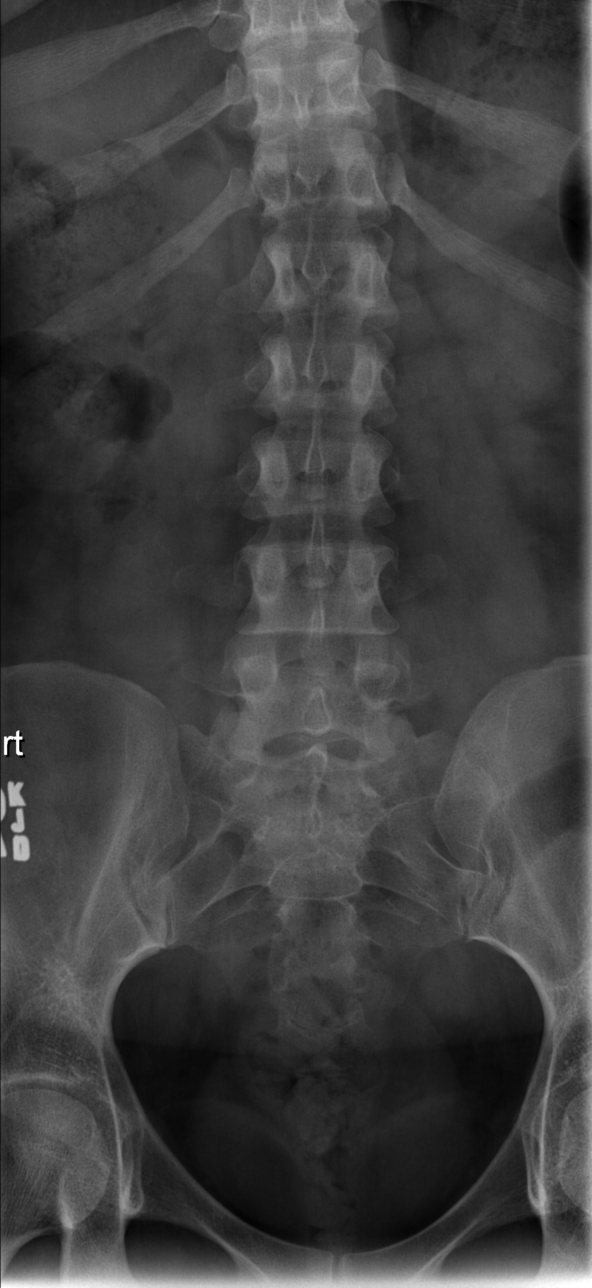

[t lumbar spine obl (1 of 2)]
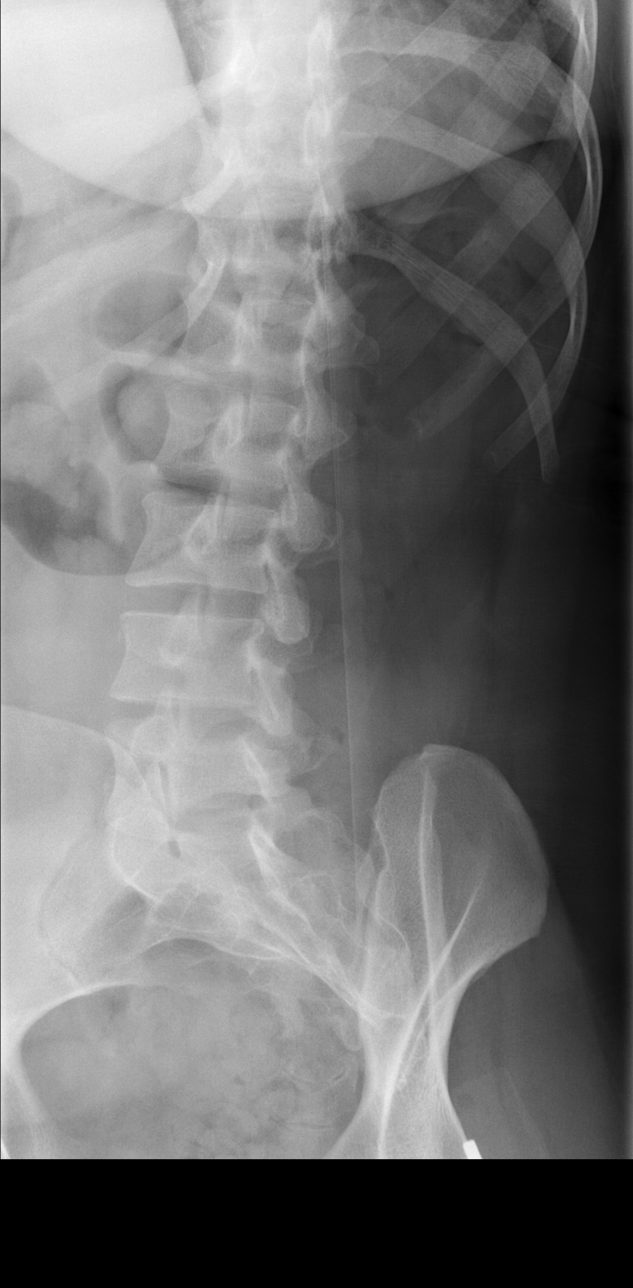

[t lumbar spine obl (2 of 2)]
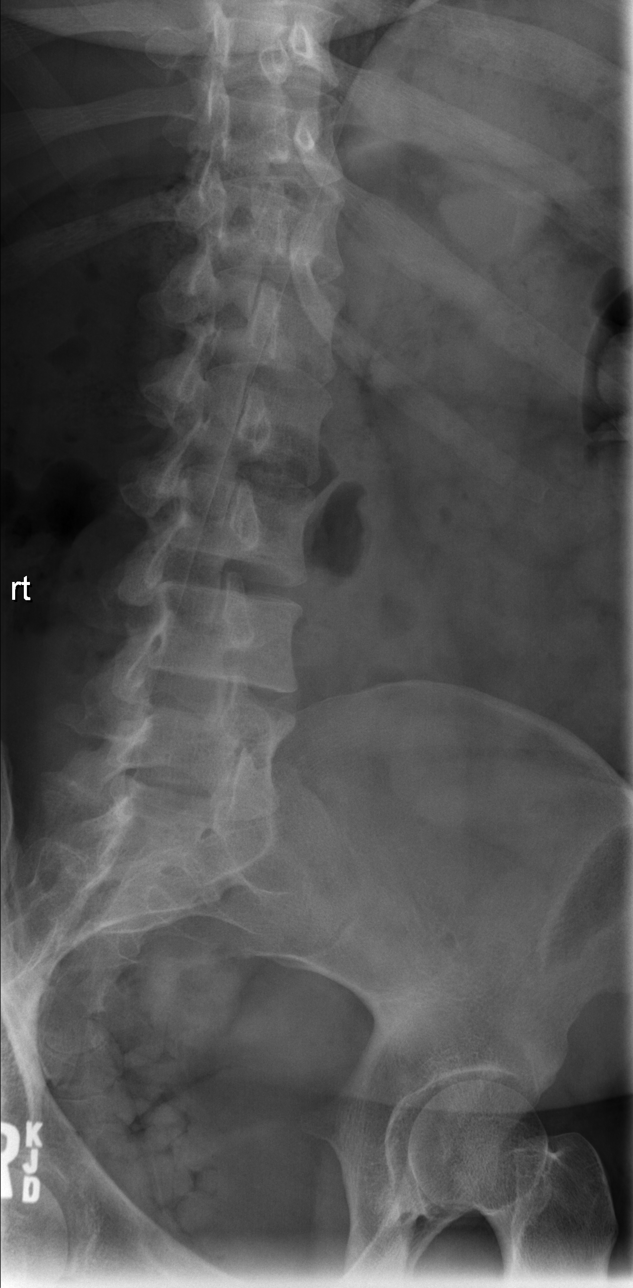

[t lumbar spine lat]
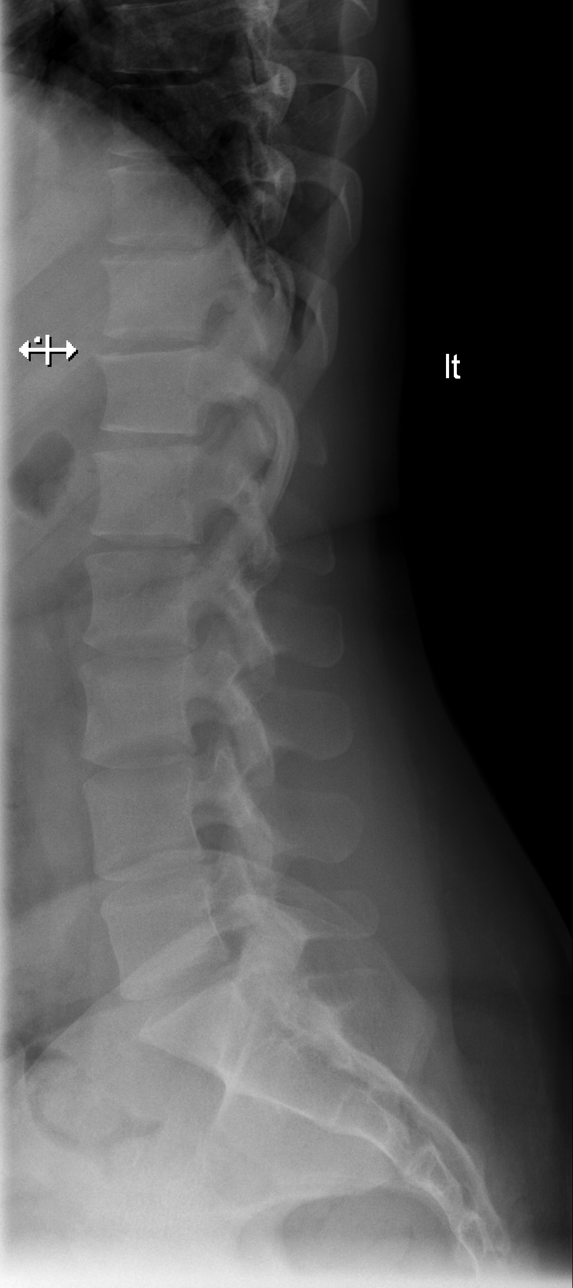

[t lumbar l-5 s-1 spot]
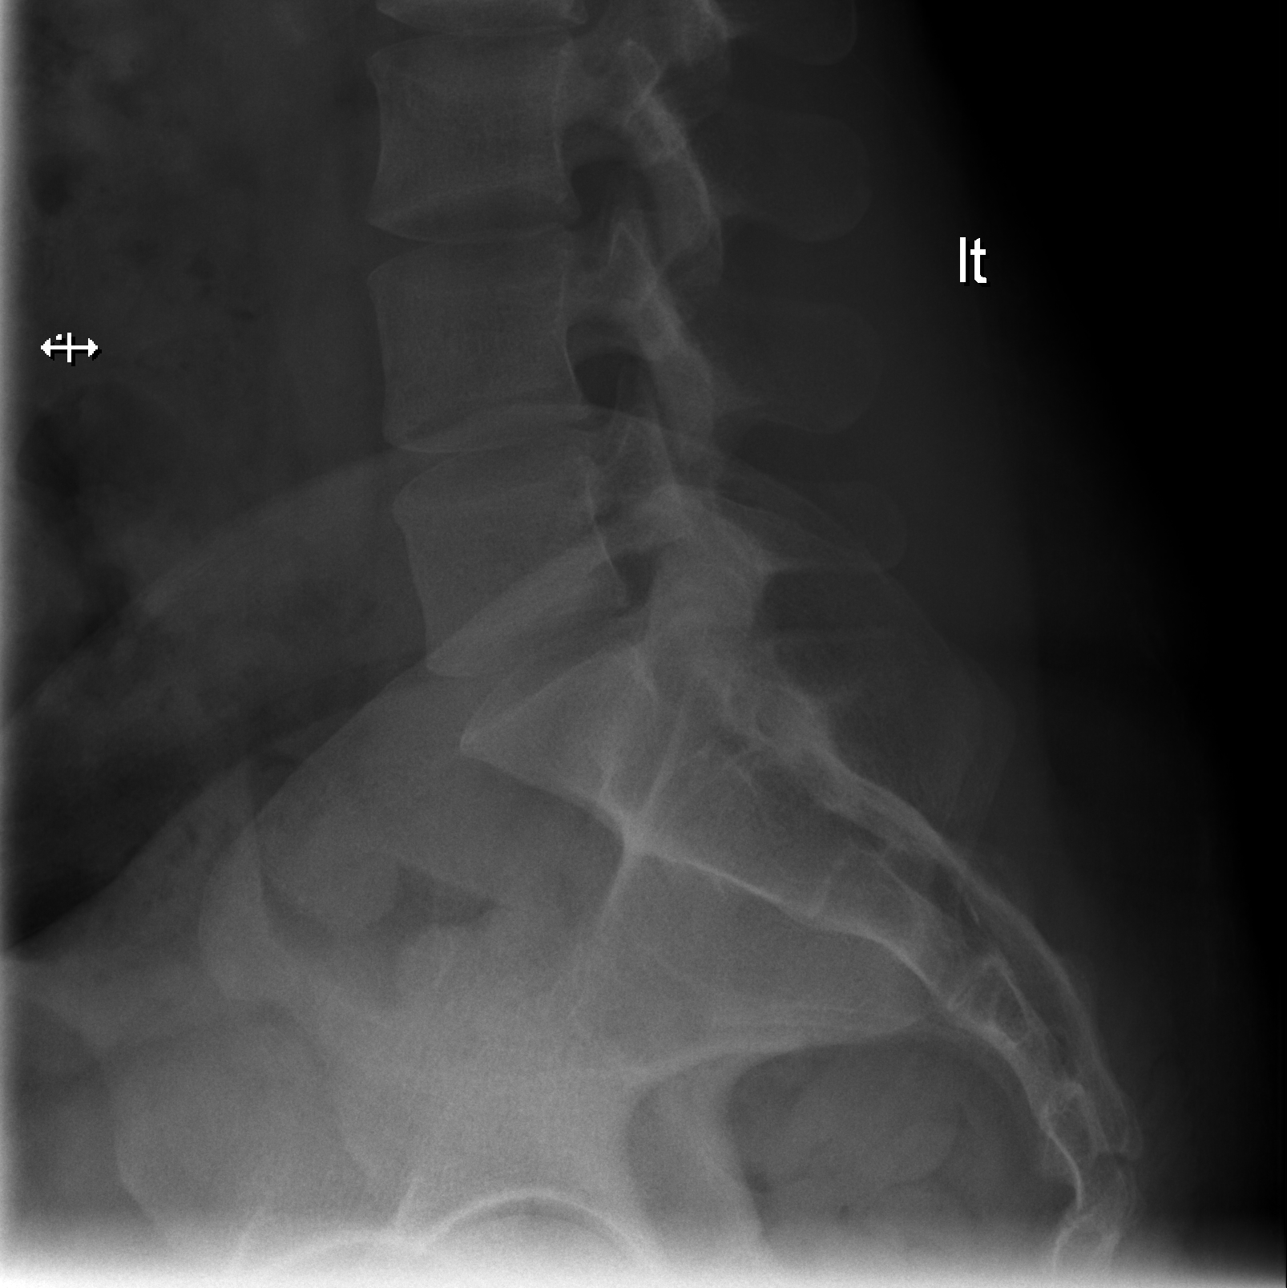

[5 of 5 positions shown; findings below may reference images not displayed]

FINDINGS: There is no evidence of lumbar spine fracture. Alignment is normal.
Intervertebral disc spaces are maintained.
IMPRESSION: Normal lumbar spine.

## 2017-11-07 ENCOUNTER — Other Ambulatory Visit: Payer: Self-pay | Admitting: General Practice

## 2017-11-07 DIAGNOSIS — J4520 Mild intermittent asthma, uncomplicated: Secondary | ICD-10-CM

## 2017-11-07 MED ORDER — FLUTICASONE-SALMETEROL 250-50 MCG/DOSE IN AEPB: 1.0000 | INHALATION_SPRAY | Freq: Two times a day (BID) | RESPIRATORY_TRACT | 6 refills | Status: DC

## 2017-11-07 MED ORDER — ALBUTEROL SULFATE HFA 108 (90 BASE) MCG/ACT IN AERS: INHALATION_SPRAY | RESPIRATORY_TRACT | 6 refills | Status: DC

## 2017-11-07 NOTE — Telephone Encounter (Signed)
Patient is requesting inhaler

## 2017-11-08 ENCOUNTER — Other Ambulatory Visit: Payer: Self-pay | Admitting: Internal Medicine

## 2017-11-08 ENCOUNTER — Other Ambulatory Visit: Payer: Self-pay | Admitting: *Deleted

## 2017-11-08 DIAGNOSIS — J4520 Mild intermittent asthma, uncomplicated: Secondary | ICD-10-CM

## 2017-11-08 MED ORDER — FLUTICASONE-SALMETEROL 250-50 MCG/DOSE IN AEPB: 1.0000 | INHALATION_SPRAY | Freq: Two times a day (BID) | RESPIRATORY_TRACT | 6 refills | Status: DC

## 2017-11-08 MED ORDER — ALBUTEROL SULFATE HFA 108 (90 BASE) MCG/ACT IN AERS: INHALATION_SPRAY | RESPIRATORY_TRACT | 6 refills | Status: AC

## 2017-11-08 NOTE — Telephone Encounter (Signed)
rtc to pt, she has changed pharmacies and request scripts be resent, sent request to dr wallace

## 2017-11-08 NOTE — Telephone Encounter (Signed)
Patient is calling back, she is requesting a refill on inhaler

## 2017-11-29 ENCOUNTER — Encounter: Payer: Self-pay | Admitting: Internal Medicine

## 2017-11-29 ENCOUNTER — Other Ambulatory Visit (HOSPITAL_COMMUNITY)
Admission: RE | Admit: 2017-11-29 | Discharge: 2017-11-29 | Disposition: A | Discharge status: Home / Self Care | Payer: BLUE CROSS/BLUE SHIELD | Source: Ambulatory Visit | Attending: Student in an Organized Health Care Education/Training Program | Admitting: Student in an Organized Health Care Education/Training Program

## 2017-11-29 ENCOUNTER — Other Ambulatory Visit: Payer: Self-pay

## 2017-11-29 ENCOUNTER — Ambulatory Visit: Payer: BLUE CROSS/BLUE SHIELD | Admitting: Internal Medicine

## 2017-11-29 VITALS — BP 125/87 | HR 73 | Temp 98.6°F | Ht 63.0 in | Wt 165.0 lb

## 2017-11-29 DIAGNOSIS — Z124 Encounter for screening for malignant neoplasm of cervix: Secondary | ICD-10-CM | POA: Diagnosis not present

## 2017-11-29 DIAGNOSIS — Z0000 Encounter for general adult medical examination without abnormal findings: Secondary | ICD-10-CM

## 2017-11-29 DIAGNOSIS — J309 Allergic rhinitis, unspecified: Secondary | ICD-10-CM | POA: Diagnosis not present

## 2017-11-29 MED ORDER — FEXOFENADINE HCL 180 MG PO TABS: 180.0000 mg | ORAL_TABLET | Freq: Every day | ORAL | 3 refills | Status: DC

## 2017-11-29 NOTE — Progress Notes (Signed)
   CC: here for Pap smear   HPI:  Sandra Oneill is a 32 y.o. woman with a past medical history listed below here today for follow up of her health maintenance and need for Pap smear.  For details of today's visit and the status of her chronic medical issues please refer to the assessment and plan.   Past Medical History:  Diagnosis Date  . Abnormal Pap smear    3-4 years ago  . Asthma   . Low back pain    Review of Systems:  Please see pertinent ROS reviewed in HPI and problem based charting.   Physical Exam:  Vitals:   11/29/17 1554  BP: 125/87  Pulse: 73  Temp: 98.6 F (37 C)  TempSrc: Oral  SpO2: 98%  Weight: 165 lb (74.8 kg)  Height: 5\' 3"  (1.6 m)   Physical Exam  Constitutional: She is oriented to person, place, and time and well-developed, well-nourished, and in no distress.  HENT:  Head: Normocephalic and atraumatic.  Pulmonary/Chest: Effort normal.  Genitourinary: Cervix normal and vulva normal. Cervix exhibits no motion tenderness. Right adnexum displays no tenderness. Left adnexum displays no tenderness. No vaginal discharge found.  Neurological: She is alert and oriented to person, place, and time.  Skin: Skin is warm and dry.  Psychiatric: Mood and affect normal.     Assessment & Plan:   See Encounters Tab for problem based charting.  Patient discussed with Dr. Oswaldo DoneVincent .  Allergic rhinitis Assessment: She uses Allegra and Flonase to control her symptoms.  Plan: -Refilled Allegra today.  Preventative health care Assessment: She is due for Pap smear in the coming weeks and agreeable to having this done today.  She reports no GU complaints, no recent sexual partners and is not concerned regarding any transmitted infections.  Her LMP ended on Monday this week.  Plan: - Pap smear done today with HPV co-testing given her age.  If normal, will not need further Pap for 5 years.

## 2017-11-29 NOTE — Assessment & Plan Note (Signed)
Assessment: She is due for Pap smear in the coming weeks and agreeable to having this done today.  She reports no GU complaints, no recent sexual partners and is not concerned regarding any transmitted infections.  Her LMP ended on Monday this week.  Plan: - Pap smear done today with HPV co-testing given her age.  If normal, will not need further Pap for 5 years.

## 2017-11-29 NOTE — Patient Instructions (Signed)
Thank you for coming to see me today. It was a pleasure. Today we talked about:   We did your cervical cancer screening.  We will let you know if anything is abnormal with this.  Please follow-up with us in 12 months  If you have any questions or concerns, please do not hesitate to call the office at [phone].  Take Care,   Gwynn BurlyAndrew Marlys Stegmaier, DO

## 2017-11-29 NOTE — Assessment & Plan Note (Signed)
Assessment: She uses Allegra and Flonase to control her symptoms.  Plan: -Refilled Allegra today.

## 2017-11-30 NOTE — Progress Notes (Signed)
Internal Medicine Clinic Attending  Case discussed with Dr. Wallace at the time of the visit.  We reviewed the resident's history and exam and pertinent patient test results.  I agree with the assessment, diagnosis, and plan of care documented in the resident's note.  

## 2017-12-03 LAB — CYTOLOGY - PAP
DIAGNOSIS: NEGATIVE
HPV (WINDOPATH): NOT DETECTED

## 2017-12-04 NOTE — Progress Notes (Signed)
Call to patient informed of Pap results and need to repeat in 5 years per order of Dr. Earlene PlaterWallace..  Patient voiced understanding of plan and is awaiting results of other labs that were done.  Angelina OkGladys Linette Gunderson, RN 12/04/2017 8:15 AM.

## 2018-01-25 DIAGNOSIS — B373 Candidiasis of vulva and vagina: Secondary | ICD-10-CM | POA: Diagnosis not present

## 2018-04-08 ENCOUNTER — Ambulatory Visit (INDEPENDENT_AMBULATORY_CARE_PROVIDER_SITE_OTHER): Payer: 59 | Admitting: *Deleted

## 2018-04-08 DIAGNOSIS — R69 Illness, unspecified: Secondary | ICD-10-CM | POA: Diagnosis not present

## 2018-04-08 DIAGNOSIS — Z3202 Encounter for pregnancy test, result negative: Secondary | ICD-10-CM | POA: Diagnosis not present

## 2018-04-08 DIAGNOSIS — Z113 Encounter for screening for infections with a predominantly sexual mode of transmission: Secondary | ICD-10-CM | POA: Diagnosis not present

## 2018-04-08 LAB — POCT PREGNANCY, URINE: Preg Test, Ur: NEGATIVE

## 2018-04-08 NOTE — Progress Notes (Signed)
I have reviewed the chart and agree with nursing staff's documentation of this patient's encounter.  Sandra ShellerHeather Veleria Barnhardt, CNM 04/08/2018 7:32 PM

## 2018-04-08 NOTE — Progress Notes (Signed)
Pt here for upt and std testing to include hiv and rpr. Denies any pain or vag issues. Just wanted to be tested. Has active my chart so will get results from there.

## 2018-04-09 LAB — RPR: RPR: NONREACTIVE

## 2018-04-09 LAB — HIV ANTIBODY (ROUTINE TESTING W REFLEX): HIV Screen 4th Generation wRfx: NONREACTIVE

## 2018-04-10 LAB — CERVICOVAGINAL ANCILLARY ONLY
CHLAMYDIA, DNA PROBE: NEGATIVE
NEISSERIA GONORRHEA: NEGATIVE
TRICH (WINDOWPATH): NEGATIVE

## 2018-04-13 ENCOUNTER — Encounter: Payer: Self-pay | Admitting: *Deleted

## 2018-04-23 DIAGNOSIS — N760 Acute vaginitis: Secondary | ICD-10-CM | POA: Diagnosis not present

## 2018-06-28 DIAGNOSIS — B373 Candidiasis of vulva and vagina: Secondary | ICD-10-CM | POA: Diagnosis not present

## 2018-08-04 DIAGNOSIS — N760 Acute vaginitis: Secondary | ICD-10-CM | POA: Diagnosis not present

## 2018-08-14 ENCOUNTER — Other Ambulatory Visit: Payer: Self-pay | Admitting: *Deleted

## 2018-08-14 DIAGNOSIS — J4520 Mild intermittent asthma, uncomplicated: Secondary | ICD-10-CM

## 2018-08-15 MED ORDER — FLUTICASONE-SALMETEROL 250-50 MCG/DOSE IN AEPB: 1.0000 | INHALATION_SPRAY | Freq: Two times a day (BID) | RESPIRATORY_TRACT | 1 refills | Status: DC

## 2018-09-19 DIAGNOSIS — N926 Irregular menstruation, unspecified: Secondary | ICD-10-CM | POA: Diagnosis not present

## 2018-11-18 ENCOUNTER — Encounter: Payer: Self-pay | Admitting: *Deleted

## 2018-11-25 DIAGNOSIS — R05 Cough: Secondary | ICD-10-CM | POA: Diagnosis not present

## 2018-12-23 DIAGNOSIS — Z0000 Encounter for general adult medical examination without abnormal findings: Secondary | ICD-10-CM | POA: Diagnosis not present

## 2018-12-23 DIAGNOSIS — Z1329 Encounter for screening for other suspected endocrine disorder: Secondary | ICD-10-CM | POA: Diagnosis not present

## 2018-12-23 DIAGNOSIS — Z13220 Encounter for screening for lipoid disorders: Secondary | ICD-10-CM | POA: Diagnosis not present

## 2018-12-23 DIAGNOSIS — R69 Illness, unspecified: Secondary | ICD-10-CM | POA: Diagnosis not present

## 2018-12-23 DIAGNOSIS — Z130 Encounter for screening for diseases of the blood and blood-forming organs and certain disorders involving the immune mechanism: Secondary | ICD-10-CM | POA: Diagnosis not present

## 2019-06-07 DIAGNOSIS — Z20828 Contact with and (suspected) exposure to other viral communicable diseases: Secondary | ICD-10-CM | POA: Diagnosis not present

## 2019-06-27 DIAGNOSIS — Z3009 Encounter for other general counseling and advice on contraception: Secondary | ICD-10-CM | POA: Diagnosis not present

## 2019-06-27 DIAGNOSIS — Z3169 Encounter for other general counseling and advice on procreation: Secondary | ICD-10-CM | POA: Diagnosis not present

## 2019-12-15 DIAGNOSIS — Z130 Encounter for screening for diseases of the blood and blood-forming organs and certain disorders involving the immune mechanism: Secondary | ICD-10-CM | POA: Diagnosis not present

## 2019-12-15 DIAGNOSIS — Z6826 Body mass index (BMI) 26.0-26.9, adult: Secondary | ICD-10-CM | POA: Diagnosis not present

## 2019-12-15 DIAGNOSIS — R102 Pelvic and perineal pain: Secondary | ICD-10-CM | POA: Diagnosis not present

## 2019-12-15 DIAGNOSIS — Z01419 Encounter for gynecological examination (general) (routine) without abnormal findings: Secondary | ICD-10-CM | POA: Diagnosis not present

## 2019-12-15 DIAGNOSIS — R69 Illness, unspecified: Secondary | ICD-10-CM | POA: Diagnosis not present

## 2020-01-01 DIAGNOSIS — R102 Pelvic and perineal pain: Secondary | ICD-10-CM | POA: Diagnosis not present

## 2020-01-01 DIAGNOSIS — N945 Secondary dysmenorrhea: Secondary | ICD-10-CM | POA: Diagnosis not present

## 2020-01-29 DIAGNOSIS — N3001 Acute cystitis with hematuria: Secondary | ICD-10-CM | POA: Diagnosis not present

## 2021-10-18 ENCOUNTER — Emergency Department (HOSPITAL_COMMUNITY)
Admission: EM | Admit: 2021-10-18 | Discharge: 2021-10-18 | Disposition: A | Discharge status: Home / Self Care | Payer: 59 | Attending: Emergency Medicine | Admitting: Emergency Medicine

## 2021-10-18 ENCOUNTER — Encounter (HOSPITAL_COMMUNITY): Payer: Self-pay

## 2021-10-18 ENCOUNTER — Other Ambulatory Visit: Payer: Self-pay

## 2021-10-18 DIAGNOSIS — Z79899 Other long term (current) drug therapy: Secondary | ICD-10-CM | POA: Insufficient documentation

## 2021-10-18 DIAGNOSIS — L500 Allergic urticaria: Secondary | ICD-10-CM | POA: Insufficient documentation

## 2021-10-18 DIAGNOSIS — T7840XA Allergy, unspecified, initial encounter: Secondary | ICD-10-CM

## 2021-10-18 DIAGNOSIS — R509 Fever, unspecified: Secondary | ICD-10-CM | POA: Insufficient documentation

## 2021-10-18 DIAGNOSIS — R21 Rash and other nonspecific skin eruption: Secondary | ICD-10-CM | POA: Diagnosis present

## 2021-10-18 DIAGNOSIS — Z20822 Contact with and (suspected) exposure to covid-19: Secondary | ICD-10-CM | POA: Insufficient documentation

## 2021-10-18 LAB — RESP PANEL BY RT-PCR (FLU A&B, COVID) ARPGX2
Influenza A by PCR: NEGATIVE
Influenza B by PCR: NEGATIVE
SARS Coronavirus 2 by RT PCR: NEGATIVE

## 2021-10-18 LAB — GROUP A STREP BY PCR: Group A Strep by PCR: NOT DETECTED

## 2021-10-18 MED ORDER — DIPHENHYDRAMINE HCL 25 MG PO TABS
25.0000 mg | ORAL_TABLET | Freq: Four times a day (QID) | ORAL | 0 refills | Status: DC | PRN
Start: 2021-10-18 — End: 2024-07-14

## 2021-10-18 MED ORDER — PREDNISONE 20 MG PO TABS
60.0000 mg | ORAL_TABLET | Freq: Once | ORAL | Status: AC
  Administered 2021-10-18: 60 mg via ORAL
  Filled 2021-10-18: qty 3

## 2021-10-18 MED ORDER — DIPHENHYDRAMINE HCL 25 MG PO CAPS
25.0000 mg | ORAL_CAPSULE | Freq: Once | ORAL | Status: AC
  Administered 2021-10-18: 25 mg via ORAL
  Filled 2021-10-18: qty 1

## 2021-10-18 MED ORDER — PREDNISONE 10 MG PO TABS
20.0000 mg | ORAL_TABLET | Freq: Every day | ORAL | 0 refills | Status: AC
Start: 2021-10-18 — End: 2021-10-23

## 2021-10-18 MED ORDER — ACETAMINOPHEN 325 MG PO TABS
650.0000 mg | ORAL_TABLET | Freq: Once | ORAL | Status: AC
  Administered 2021-10-18: 650 mg via ORAL
  Filled 2021-10-18: qty 2

## 2021-10-18 MED ORDER — FAMOTIDINE 20 MG PO TABS
20.0000 mg | ORAL_TABLET | Freq: Two times a day (BID) | ORAL | 0 refills | Status: DC
Start: 2021-10-18 — End: 2022-05-18

## 2021-10-18 MED ORDER — FAMOTIDINE 20 MG PO TABS
20.0000 mg | ORAL_TABLET | Freq: Once | ORAL | Status: AC
  Administered 2021-10-18: 20 mg via ORAL
  Filled 2021-10-18: qty 1

## 2021-10-18 NOTE — ED Provider Notes (Signed)
[facility] DEPT Provider Note   CSN: [id number] Arrival date & time: 10/18/21  0202     History  Chief Complaint  Patient presents with   Allergic Reaction    Sandra Oneill is a 36 y.o. female  who presents to the ED today with complaint of itchy rash to body that began a few hours ago. Pt reports that she began experiencing fevers and chills earlier tonight. She went to take a bath and then afterwards noticed the rash. She states that she used a new epsom salt 2 nights ago however did not use any today. Per triage report pt ate spaghetti last night/is allergic to tomatoes however pt tells me she is able to eat spaghetti without issues and does not believe it is related. She denies recent sick contacts. She states she has a mild headache.   The history is provided by the patient and medical records.      Home Medications Prior to Admission medications   Medication Sig Start Date End Date Taking? Authorizing Provider  diphenhydrAMINE (BENADRYL) 25 MG tablet Take 1 tablet (25 mg total) by mouth every 6 (six) hours as needed. 10/18/21  Yes Loretta Kluender, PA-C  famotidine (PEPCID) 20 MG tablet Take 1 tablet (20 mg total) by mouth 2 (two) times daily. 10/18/21  Yes Malanie Koloski, PA-C  predniSONE (DELTASONE) 10 MG tablet Take 2 tablets (20 mg total) by mouth daily for 5 days. 10/18/21 10/23/21 Yes Aneesah Hernan, PA-C  albuterol (PROAIR HFA) 108 (90 Base) MCG/ACT inhaler INHALE TWO PUFFS BY MOUTH EVERY 6 HOURS AS NEEDED FOR SHORTNESS OF BREATH 11/08/17   Mignon Pine, DO  fexofenadine [facility] ALLERGY) 180 MG tablet Take 1 tablet (180 mg total) by mouth daily. 11/29/17   Mignon Pine, DO  fluticasone (FLONASE) 50 MCG/ACT nasal spray Place 2 sprays into both nostrils daily. 07/10/16   Mignon Pine, DO  Fluticasone-Salmeterol (ADVAIR) 250-50 MCG/DOSE AEPB Inhale 1 puff into the lungs 2 (two) times daily. 08/15/18   Isabelle Course, MD      Allergies     Amoxicillin, Sulfamethoxazole-trimethoprim, and Tomato    Review of Systems   Review of Systems  Constitutional:  Positive for chills and fever.  HENT:  Negative for sore throat and trouble swallowing.   Eyes:  Negative for visual disturbance.  Respiratory:  Negative for shortness of breath and wheezing.   Cardiovascular:  Negative for chest pain.  Gastrointestinal:  Negative for abdominal pain, nausea and vomiting.  Genitourinary:  Negative for dysuria, flank pain and frequency.  Skin:  Positive for rash.  Neurological:  Positive for headaches.  All other systems reviewed and are negative.  Physical Exam Updated Vital Signs BP 140/69 (BP Location: Left Arm)    Pulse (!) 116    Temp (!) 100.6 F (38.1 C) (Oral)    Resp 17    Ht 5\' 2"  (1.575 m)    Wt 61.2 kg    SpO2 100%    BMI 24.69 kg/m  Physical Exam Vitals and nursing note reviewed.  Constitutional:      Appearance: She is not ill-appearing or diaphoretic.  HENT:     Head: Normocephalic and atraumatic.     Mouth/Throat:     Mouth: Mucous membranes are moist.     Comments: Uvula midline. No posterior oropharyngeal erythema or edema. Phonating normally.  Eyes:     Conjunctiva/sclera: Conjunctivae normal.  Cardiovascular:     Rate and Rhythm: Normal  rate and regular rhythm.     Pulses: Normal pulses.  Pulmonary:     Effort: Pulmonary effort is normal.     Breath sounds: Normal breath sounds. No wheezing, rhonchi or rales.     Comments: Speaking in full sentences without difficulty. Satting 100% on RA. LCTAB. Abdominal:     Palpations: Abdomen is soft.     Tenderness: There is no abdominal tenderness.  Musculoskeletal:     Cervical back: Neck supple.  Skin:    General: Skin is warm and dry.     Findings: Rash present.     Comments: Urticarial rash to arms and torso  Neurological:     Mental Status: She is alert.    ED Results / Procedures / Treatments   Labs (all labs ordered are listed, but only abnormal results  are displayed) Labs Reviewed  RESP PANEL BY RT-PCR (FLU A&B, COVID) [id number]  GROUP A STREP BY PCR    EKG None  Radiology No results found.  Procedures Procedures    Medications Ordered in ED Medications  predniSONE (DELTASONE) tablet 60 mg (60 mg Oral Given 10/18/21 0303)  famotidine (PEPCID) tablet 20 mg (20 mg Oral Given 10/18/21 0303)  diphenhydrAMINE (BENADRYL) capsule 25 mg (25 mg Oral Given 10/18/21 0303)  acetaminophen (TYLENOL) tablet 650 mg (650 mg Oral Given 10/18/21 0303)    ED Course/ Medical Decision Making/ A&P                           Medical Decision Making 36 year old female who presents to the ED today with complaint of urticarial rash that began earlier last night.  She also complains of fevers and chills without any other specific symptoms.  On arrival to the ED today patient is febrile 100.6 and tachycardic at 116.  She is sitting comfortably upright in the triage chair.  She is noted to have urticaria to her arms and torso.  She states that she started using new Epsom salts 2 nights ago however has not used any since.  No other new meds, soaps, detergents, hygiene products, foods, medications.  Physical exam reassuring at this time.  Uvula midline, phonating normally, no posterior oropharyngeal edema.  Lungs clear to auscultation bilaterally without wheezing.  She has no complaints of throat swelling, tongue swelling, shortness of breath.  Will provide Tylenol for fever.  We will plan to swab for COVID, flu, strep as well however rash does not seem consistent with strep pharyngitis at this time.  Difficult to assess what she is reacting to.  Will provide Pepcid, Benadryl, prednisone and reevaluate.  I do not feel patient requires EpiPen at this time given mild symptoms.   Flu, COVID, strep test were all negative.  Evaluation patient continues to rest comfortably.  She reports that she is no longer itching however continues to have a mild rash to her arms.  We will to  assess what she is reacting to.  We will plan to discharge home with additional course of prednisone, Benadryl, Pepcid.  Patient instructed to follow-up with her PCP for further evaluation and to return to the ED for any new/worsening symptoms.  Given no other symptoms difficult to assess why she had fever tonight.  She denies any urinary symptoms, abdominal pain, chest pain/cough I do not feel patient requires any additional labs or imaging at this time given overall well appearance.  Stable for discharge.  Final Clinical Impression(s) / ED Diagnoses Final diagnoses:  Allergic reaction, initial encounter  Febrile illness    Rx / DC Orders ED Discharge Orders          Ordered    predniSONE (DELTASONE) 10 MG tablet  Daily        10/18/21 0424    diphenhydrAMINE (BENADRYL) 25 MG tablet  Every 6 hours PRN        10/18/21 0424    famotidine (PEPCID) 20 MG tablet  2 times daily        10/18/21 0424             Discharge Instructions      Please pick up medications and take as prescribed for allergic reaction       Eustaquio Maize, PA-C 10/18/21 [id number]    Veryl Speak, MD 10/18/21 [phone]

## 2021-10-18 NOTE — ED Provider Notes (Signed)
Emergency Medicine Provider Triage Evaluation Note  Sandra Oneill , a 36 y.o. female  was evaluated in triage.  Pt complains of itchy rash to body that began a few hours ago. Pt reports that she began experiencing fevers and chills earlier tonight. She went to take a bath and then afterwards noticed the rash. She states that she used a new epsom salt 2 nights ago however did not use any today. Per triage report pt ate spaghetti last night/is allergic to tomatoes however pt tells me she is able to eat spaghetti without issues and does not believe it is related. She denies recent sick contacts. She states she has a mild headache.   Review of Systems  Positive: + fever, chills, rash Negative: - SOB, wheezing, sore throat  Physical Exam  BP 140/69 (BP Location: Left Arm)    Pulse (!) 116    Temp (!) 100.6 F (38.1 C) (Oral)    Resp 17    Ht 5\' 2"  (1.575 m)    Wt 61.2 kg    SpO2 100%    BMI 24.69 kg/m  Gen:   Awake, no distress   Resp:  Normal effort  MSK:   Moves extremities without difficulty  Other:  Urticarial rash to arms, abdomen. Uvula midline. Posterior oropharynx clear. LCTAB without wheezing.   Medical Decision Making  Medically screening exam initiated at 2:57 AM.  Appropriate orders placed.  ROSEMARY MOSSBARGER was informed that the remainder of the evaluation will be completed by another provider, this initial triage assessment does not replace that evaluation, and the importance of remaining in the ED until their evaluation is complete.     Ames Dura, PA-C 10/18/21 12/16/21    [id number], MD 10/18/21 [id number]

## 2021-10-18 NOTE — ED Triage Notes (Signed)
Pt reports with hives from an allergic reaction yesterday evening. Pt has hives to her arms, torso, and legs. Pt ate spaghetti last night but is allergic to tomatoes.

## 2021-10-18 NOTE — Discharge Instructions (Addendum)
Please pick up medications and take as prescribed for allergic reaction  Follow up with your PCP for further eval  Return to the ED for any new/worsening symptoms

## 2021-10-26 NOTE — Progress Notes (Signed)
New Patient Note  RE: Sandra Oneill MRN: [id number] DOB: 1985-11-19 Date of Office Visit: 10/27/2021  Consult requested by: Teodora Medici, FNP Primary care provider: Teodora Medici, FNP  Chief Complaint: Urticaria (Last week had a fever and headache when she broke out in hives. Was tested for covid and strep at the emergency room. Was given benadryl, pepcid and prednisone. ) and Asthma (Tuesday at work her chest was bothering her. It was not tight )  History of Present Illness: I had the pleasure of seeing Sandra Oneill for initial evaluation at the [facility] on 10/27/2021. She is a 36 y.o. female, who is referred here by Teodora Medici, FNP for the evaluation of urticaria.  Rash started on 10/17/2021 at night. She was taking a bath and noticed some itching initially and then it spread to her arms, and torso. She took benadryl which did not help. She woke up at 1AM with fevers, chills and hives.  She went to the ER and had negative covid/strep testing - she got prednisone and benadryl. Went to see PCP on 10/20/2021.  Describes them as itchy, red, raised. Individual rashes lasts about less than 1 day at a time. No ecchymosis upon resolution. Associated symptoms include: none.   Patient had spaghetti with hamburger, garlic bread, corn on the cob, chicken and she had these foods since then with no issues.  Suspected triggers are unknown. Denies any changes in medications, foods, personal care products or recent infections. She has tried the following therapies: benadryl and prednisone with good benefit. Systemic steroids yes. Currently on no.  Previous work up includes: none. Previous history of rash/hives: patient broke out in hives about 5 years ago with no triggers. Patient is up to date with the following cancer screening tests: patient has physical scheduled in March.  Reviewed pictures - consistent with urticarial rash.   10/18/2018 ER visit: "36 year old  female who presents to the ED today with complaint of urticarial rash that began earlier last night.  She also complains of fevers and chills without any other specific symptoms.  On arrival to the ED today patient is febrile 100.6 and tachycardic at 116.  She is sitting comfortably upright in the triage chair.  She is noted to have urticaria to her arms and torso.  She states that she started using new Epsom salts 2 nights ago however has not used any since.  No other new meds, soaps, detergents, hygiene products, foods, medications.  Physical exam reassuring at this time.  Uvula midline, phonating normally, no posterior oropharyngeal edema.  Lungs clear to auscultation bilaterally without wheezing.  She has no complaints of throat swelling, tongue swelling, shortness of breath.  Will provide Tylenol for fever.  We will plan to swab for COVID, flu, strep as well however rash does not seem consistent with strep pharyngitis at this time.  Difficult to assess what she is reacting to.  Will provide Pepcid, Benadryl, prednisone and reevaluate.  I do not feel patient requires EpiPen at this time given mild symptoms.    Flu, COVID, strep test were all negative.   Evaluation patient continues to rest comfortably.  She reports that she is no longer itching however continues to have a mild rash to her arms.  We will to assess what she is reacting to.  We will plan to discharge home with additional course of prednisone, Benadryl, Pepcid.  Patient instructed to follow-up with her PCP for further evaluation  and to return to the ED for any new/worsening symptoms.  Given no other symptoms difficult to assess why she had fever tonight.  She denies any urinary symptoms, abdominal pain, chest pain/cough I do not feel patient requires any additional labs or imaging at this time given overall well appearance.  Stable for discharge."  Assessment and Plan: Sandra Oneill is a 36 y.o. female with: Urticaria 1 episode of hive outbreak  after taking a bath.  Prednisone and Benadryl eventually resolved symptoms.  Denies any changes in diet, medication personal care products.  Questionable infection as she had a fever but negative flu/COVID/strep testing in the ER. 5 years ago had hives with no known triggers. Discussed that based on clinical history not sure what caused the above episodes. Unlikely to be environmental/food allergy related as she had the same food ingredients of what she ate that day since then with no issues. No indication for any skin prick testing today.  Keep track of symptoms and take pictures. During a hive flare up:  Start zyrtec (cetirizine) 10mg  twice a day. If symptoms are not controlled or causes drowsiness let us know. Start Pepcid (famotidine) 20mg  twice a day.  Avoid the following potential triggers: alcohol, tight clothing, NSAIDs, hot showers and getting overheated. Get bloodwork to rule out other etiologies.   Moderate persistent asthma without complication Currently on Advair 250 mcg 1 puff twice a day for many years and uses albuterol on rare occasions.  Not sure of prior thrush diagnosis but has noticed little white spots on the back of her throat at times. Today's spirometry was normal. Daily controller medication(s): START Symbicort 41mcg 2 puffs twice a day with spacer and rinse mouth afterwards. This replaces Advair Diskus - hopefully this will decrease the thrush episodes.  Spacer given and demonstrated proper use with inhaler. Patient understood technique and all questions/concerned were addressed.  May use albuterol rescue inhaler 2 puffs every 4 to 6 hours as needed for shortness of breath, chest tightness, coughing, and wheezing. May use albuterol rescue inhaler 2 puffs 5 to 15 minutes prior to strenuous physical activities. Monitor frequency of use.  Get spirometry at next visit.  Oral thrush Take nystatin 2mL 4 times a day - swish and swallow. Make sure you rinse your mouth or  brush your teeth after using your inhalers to prevent thrush.  Return in about 3 months (around 01/25/2022).  Meds ordered this encounter  Medications   budesonide-formoterol (SYMBICORT) 80-4.5 MCG/ACT inhaler    Sig: Inhale 2 puffs into the lungs in the morning and at bedtime. with spacer and rinse mouth afterwards.    Dispense:  1 each    Refill:  5   nystatin (MYCOSTATIN) 100000 UNIT/ML suspension    Sig: Take 5 mLs (500,000 Units total) by mouth 4 (four) times daily. Swish and swallow    Dispense:  60 mL    Refill:  0   Lab Orders         Alpha-Gal Panel         ANA w/Reflex         C3 and C4         CBC with Differential/Platelet         Chronic Urticaria         Comprehensive metabolic panel         C-reactive protein         Sedimentation rate         Tryptase  Tomato IgE         Thyroid Cascade Profile      Other allergy screening: Asthma: yes Stable with Advair 274mcg 1 puff BID x many years and uses albuterol on rare occasions. Not sure of triggers.   Not sure how many times she got thrush but notices the white spots on the back of her throat at time and tries to scrape it off. She forgets to rinse her mouth after using Advair. Not sure if she ever took medication for oral thrush.   Rhino conjunctivitis:  Some rhinitis symptoms in the spring and takes OTC antihistamines. Had skin testing as a child - showed multiple positives per patient report. No prior AIT.  Food allergy: no Avoiding raw tomatoes due to rash but tolerates it in spaghetti sauce and pizza sauce.  Medication allergy: yes Hymenoptera allergy: no Eczema: as a child History of recurrent infections suggestive of immunodeficency: no  Diagnostics: Spirometry:  Tracings reviewed. Her effort: Good reproducible efforts. FVC: 2.86L FEV1: 2.18L, 87% predicted FEV1/FVC ratio: 76% Interpretation: Spirometry consistent with normal pattern.  Please see scanned spirometry results for  details.  Past Medical History: Patient Active Problem List   Diagnosis Date Noted   Moderate persistent asthma without complication 0000000   Oral thrush 10/27/2021   Other adverse food reactions, not elsewhere classified, subsequent encounter 10/27/2021   Allergic rhinitis 11/25/2014   Urticaria 05/04/2014   Preventative health care 05/27/2012   ASCUS PAP 01/15/2007   Mild intermittent asthma in adult without complication [id number]   Past Medical History:  Diagnosis Date   Abnormal Pap smear    3-4 years ago   Asthma    Low back pain    Past Surgical History: Past Surgical History:  Procedure Laterality Date   WISDOM TOOTH EXTRACTION  12/15 and 2/16   2 teeth   Medication List:  Current Outpatient Medications  Medication Sig Dispense Refill   albuterol (PROAIR HFA) 108 (90 Base) MCG/ACT inhaler INHALE TWO PUFFS BY MOUTH EVERY 6 HOURS AS NEEDED FOR SHORTNESS OF BREATH 9 each 6   budesonide-formoterol (SYMBICORT) 80-4.5 MCG/ACT inhaler Inhale 2 puffs into the lungs in the morning and at bedtime. with spacer and rinse mouth afterwards. 1 each 5   diphenhydrAMINE (BENADRYL) 25 MG tablet Take 1 tablet (25 mg total) by mouth every 6 (six) hours as needed. 30 tablet 0   famotidine (PEPCID) 20 MG tablet Take 1 tablet (20 mg total) by mouth 2 (two) times daily. 30 tablet 0   fexofenadine (ALLEGRA ALLERGY) 180 MG tablet Take 1 tablet (180 mg total) by mouth daily. 30 tablet 3   nystatin (MYCOSTATIN) 100000 UNIT/ML suspension Take 5 mLs (500,000 Units total) by mouth 4 (four) times daily. Swish and swallow 60 mL 0   No current facility-administered medications for this visit.   Allergies: Allergies  Allergen Reactions   Amoxicillin     REACTION: Nose bleeds and dizziness   Sulfamethoxazole-Trimethoprim     REACTION: itching or arms, legs, and lips as well as tingling of lips   Tomato Hives and Rash    RAW TOMATO   Social History: Social History   Socioeconomic History    Marital status: Single    Spouse name: Not on file   Number of children: Not on file   Years of education: Not on file   Highest education level: Not on file  Occupational History   Not on file  Tobacco Use   Smoking status: Former  Packs/day: 0.10    Types: Cigars, Cigarettes    Quit date: 08/07/2011    Years since quitting: 10.2   Smokeless tobacco: Never  Substance and Sexual Activity   Alcohol use: Yes    Alcohol/week: 0.0 standard drinks    Comment: occasional   Drug use: No   Sexual activity: Yes    Birth control/protection: None  Other Topics Concern   Not on file  Social History Narrative   Not on file   Social Determinants of Health   Financial Resource Strain: Not on file  Food Insecurity: Not on file  Transportation Needs: Not on file  Physical Activity: Not on file  Stress: Not on file  Social Connections: Not on file   Lives in a house which is 37 years old. 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  HENT:  Negative for congestion and rhinorrhea.   Eyes:  Negative for itching.  Respiratory:  Negative for cough, chest tightness, shortness of breath and wheezing.   Cardiovascular:  Negative for chest pain.  Gastrointestinal:  Negative for abdominal pain.  Genitourinary:  Negative for difficulty urinating.  Skin:   Positive for rash.  Neurological:  Negative for headaches.   Objective: BP 120/72    Pulse 66    Temp 97.6 F (36.4 C) (Temporal)    Resp 18    Ht 5\' 2"  (1.575 m)    Wt 143 lb 12.8 oz (65.2 kg)    SpO2 98%    BMI 26.30 kg/m  Body mass index is 26.3 kg/m. Physical Exam Vitals and nursing note reviewed.  Constitutional:      Appearance: Normal appearance. She is well-developed.  HENT:     Head: Normocephalic and atraumatic.     Right Ear: Tympanic membrane and external ear normal.     Left Ear: Tympanic membrane and external ear normal.     Nose: Nose normal.     Mouth/Throat:     Mouth: Mucous membranes are moist.     Comments: Some white spots on the soft palate. Eyes:     Conjunctiva/sclera: Conjunctivae normal.  Cardiovascular:     Rate and Rhythm: Normal rate and regular rhythm.     Heart sounds: Normal heart sounds. No murmur heard.   No friction  rub. No gallop.  Pulmonary:     Effort: Pulmonary effort is normal.     Breath sounds: Normal breath sounds. No wheezing, rhonchi or rales.  Musculoskeletal:     Cervical back: Neck supple.  Skin:    General: Skin is warm.     Findings: No rash.  Neurological:     Mental Status: She is alert and oriented to person, place, and time.  Psychiatric:        Behavior: Behavior normal.   The plan was reviewed with the patient/family, and all questions/concerned were addressed.  It was my pleasure to see Sandra Oneill today and participate in her care. Please feel free to contact me with any questions or concerns.  Sincerely,  Rexene Alberts, DO Allergy & Immunology  [facility] office: [place] office: [phone]

## 2021-10-27 ENCOUNTER — Encounter: Payer: Self-pay | Admitting: Allergy

## 2021-10-27 ENCOUNTER — Ambulatory Visit: Payer: 59 | Admitting: Allergy

## 2021-10-27 ENCOUNTER — Other Ambulatory Visit: Payer: Self-pay

## 2021-10-27 VITALS — BP 120/72 | HR 66 | Temp 97.6°F | Resp 18 | Ht 62.0 in | Wt 143.8 lb

## 2021-10-27 DIAGNOSIS — B370 Candidal stomatitis: Secondary | ICD-10-CM | POA: Diagnosis not present

## 2021-10-27 DIAGNOSIS — J4540 Moderate persistent asthma, uncomplicated: Secondary | ICD-10-CM

## 2021-10-27 DIAGNOSIS — L509 Urticaria, unspecified: Secondary | ICD-10-CM | POA: Diagnosis not present

## 2021-10-27 DIAGNOSIS — T781XXD Other adverse food reactions, not elsewhere classified, subsequent encounter: Secondary | ICD-10-CM

## 2021-10-27 DIAGNOSIS — T7819XD Other adverse food reactions, not elsewhere classified, subsequent encounter: Secondary | ICD-10-CM

## 2021-10-27 MED ORDER — NYSTATIN 100000 UNIT/ML MT SUSP: 5.0000 mL | Freq: Four times a day (QID) | OROMUCOSAL | 0 refills | Status: DC

## 2021-10-27 MED ORDER — BUDESONIDE-FORMOTEROL FUMARATE 80-4.5 MCG/ACT IN AERO: 2.0000 | INHALATION_SPRAY | Freq: Two times a day (BID) | RESPIRATORY_TRACT | 5 refills | Status: DC

## 2021-10-27 NOTE — Assessment & Plan Note (Signed)
1 episode of hive outbreak after taking a bath.  Prednisone and Benadryl eventually resolved symptoms.  Denies any changes in diet, medication personal care products.  Questionable infection as she had a fever but negative flu/COVID/strep testing in the ER. 5 years ago had hives with no known triggers.  Discussed that based on clinical history not sure what caused the above episodes.  Unlikely to be environmental/food allergy related as she had the same food ingredients of what she ate that day since then with no issues.  No indication for any skin prick testing today.   Keep track of symptoms and take pictures.  During a hive flare up:   Start zyrtec (cetirizine) 10mg  twice a day.  If symptoms are not controlled or causes drowsiness let know.  Start Pepcid (famotidine) 20mg  twice a day.   Avoid the following potential triggers: alcohol, tight clothing, NSAIDs, hot showers and getting overheated.  Get bloodwork to rule out other etiologies.

## 2021-10-27 NOTE — Patient Instructions (Addendum)
Hives: Not sure what caused your last episode but I don't think it's related to any allergies. Keep track of symptoms and take pictures. During a hive flare up:  Start zyrtec (cetirizine) 10mg  twice a day. If symptoms are not controlled or causes drowsiness let us know. Start pepcid (famotidine) 20mg  twice a day.   Avoid the following potential triggers: alcohol, tight clothing, NSAIDs, hot showers and getting overheated. Get bloodwork:  We are ordering labs, so please allow 1-2 weeks for the results to come back. With the newly implemented Cures Act, the labs might be visible to you at the same time that they become visible to me. However, I will not address the results until all of the results are back, so please be patient.    Asthma: Normal breathing test today. Daily controller medication(s): START Symbicort 50mcg 2 puffs twice a day with spacer and rinse mouth afterwards. This replaces Advair Diskus - hopefully you will get less thrush episodes. Spacer given and demonstrated proper use with inhaler. Patient understood technique and all questions/concerned were addressed.  May use albuterol rescue inhaler 2 puffs every 4 to 6 hours as needed for shortness of breath, chest tightness, coughing, and wheezing. May use albuterol rescue inhaler 2 puffs 5 to 15 minutes prior to strenuous physical activities. Monitor frequency of use.  Asthma control goals:  Full participation in all desired activities (may need albuterol before activity) Albuterol use two times or less a week on average (not counting use with activity) Cough interfering with sleep two times or less a month Oral steroids no more than once a year No hospitalizations   Oral thrush: Take nystatin 96mL 4 times a day - swish and swallow. Make sure you rinse your mouth or brush your teeth after using your inhalers to prevent thrush.  Follow up in 3 months or sooner if needed.

## 2021-10-27 NOTE — Assessment & Plan Note (Signed)
Currently on Advair 250 mcg 1 puff twice a day for many years and uses albuterol on rare occasions.  Not sure of prior thrush diagnosis but has noticed little white spots on the back of her throat at times.  Today's spirometry was normal.  Daily controller medication(s): START Symbicort 2 puffs twice a day with spacer and rinse mouth afterwards. o This replaces Advair Diskus - hopefully this will decrease the thrush episodes.   Spacer given and demonstrated proper use with inhaler. Patient understood technique and all questions/concerned were addressed.   May use albuterol rescue inhaler 2 puffs every 4 to 6 hours as needed for shortness of breath, chest tightness, coughing, and wheezing. May use albuterol rescue inhaler 2 puffs 5 to 15 minutes prior to strenuous physical activities. Monitor frequency of use.   Get spirometry at next visit.

## 2021-10-27 NOTE — Assessment & Plan Note (Signed)
•   Take nystatin 40mL 4 times a day - swish and swallow.  Make sure you rinse your mouth or brush your teeth after using your inhalers to prevent thrush.

## 2021-11-04 LAB — SEDIMENTATION RATE: Sed Rate: 21 mm/hr (ref 0–32)

## 2021-11-04 LAB — ALLERGEN, TOMATO F25: Allergen Tomato, IgE: 0.1 kU/L — AB

## 2021-11-04 LAB — COMPREHENSIVE METABOLIC PANEL
ALT: 25 IU/L (ref 0–32)
AST: 24 IU/L (ref 0–40)
Albumin/Globulin Ratio: 1.7 (ref 1.2–2.2)
Albumin: 4.4 g/dL (ref 3.8–4.8)
Alkaline Phosphatase: 72 IU/L (ref 44–121)
BUN/Creatinine Ratio: 15 (ref 9–23)
BUN: 11 mg/dL (ref 6–20)
Bilirubin Total: <0.2 mg/dL (ref 0.0–1.2)
CO2: 24 mmol/L (ref 20–29)
Calcium: 9.3 mg/dL (ref 8.7–10.2)
Chloride: 99 mmol/L (ref 96–106)
Creatinine, Ser: 0.71 mg/dL (ref 0.57–1.00)
Globulin, Total: 2.6 g/dL (ref 1.5–4.5)
Glucose: 108 mg/dL — ABNORMAL HIGH (ref 70–99)
Potassium: 4.1 mmol/L (ref 3.5–5.2)
Sodium: 139 mmol/L (ref 134–144)
Total Protein: 7 g/dL (ref 6.0–8.5)
eGFR: 113 mL/min/{1.73_m2} (ref >59)

## 2021-11-04 LAB — ALPHA-GAL PANEL
Allergen Lamb IgE: <0.1 kU/L
Beef IgE: 0.13 kU/L — AB
IgE (Immunoglobulin E), Serum: 1289 IU/mL — ABNORMAL HIGH (ref 6–495)
O215-IgE Alpha-Gal: <0.1 kU/L
Pork IgE: <0.1 kU/L

## 2021-11-04 LAB — C3 AND C4
Complement C3, Serum: 159 mg/dL (ref 82–167)
Complement C4, Serum: 26 mg/dL (ref 12–38)

## 2021-11-04 LAB — CBC WITH DIFFERENTIAL/PLATELET
Basophils Absolute: 0 10*3/uL (ref 0.0–0.2)
Basos: 0 %
EOS (ABSOLUTE): 1.8 10*3/uL — ABNORMAL HIGH (ref 0.0–0.4)
Eos: 17 %
Hematocrit: 37.4 % (ref 34.0–46.6)
Hemoglobin: 12.5 g/dL (ref 11.1–15.9)
Immature Grans (Abs): 0 10*3/uL (ref 0.0–0.1)
Immature Granulocytes: 0 %
Lymphocytes Absolute: 1.5 10*3/uL (ref 0.7–3.1)
Lymphs: 15 %
MCH: 30.1 pg (ref 26.6–33.0)
MCHC: 33.4 g/dL (ref 31.5–35.7)
MCV: 90 fL (ref 79–97)
Monocytes Absolute: 0.7 10*3/uL (ref 0.1–0.9)
Monocytes: 6 %
Neutrophils Absolute: 6.5 10*3/uL (ref 1.4–7.0)
Neutrophils: 62 %
Platelets: 359 10*3/uL (ref 150–450)
RBC: 4.15 x10E6/uL (ref 3.77–5.28)
RDW: 12.5 % (ref 11.7–15.4)
WBC: 10.6 10*3/uL (ref 3.4–10.8)

## 2021-11-04 LAB — C-REACTIVE PROTEIN: CRP: 112 mg/L — ABNORMAL HIGH (ref 0–10)

## 2021-11-04 LAB — TRYPTASE: Tryptase: 4.1 ug/L (ref 2.2–13.2)

## 2021-11-04 LAB — CHRONIC URTICARIA: cu index: <3.3 (ref <10)

## 2021-11-04 LAB — THYROID CASCADE PROFILE: TSH: 1.82 u[IU]/mL (ref 0.450–4.500)

## 2021-11-04 LAB — ANA W/REFLEX: Anti Nuclear Antibody (ANA): NEGATIVE

## 2021-11-08 ENCOUNTER — Ambulatory Visit: Payer: Managed Care, Other (non HMO) | Admitting: Allergy

## 2021-11-08 ENCOUNTER — Telehealth: Payer: Self-pay | Admitting: Allergy

## 2021-11-08 DIAGNOSIS — L509 Urticaria, unspecified: Secondary | ICD-10-CM

## 2021-11-08 DIAGNOSIS — R899 Unspecified abnormal finding in specimens from other organs, systems and tissues: Secondary | ICD-10-CM

## 2021-11-08 NOTE — Telephone Encounter (Signed)
Patient would like someone to call her about her lab results.

## 2021-11-08 NOTE — Telephone Encounter (Signed)
Copied and pasted result note below.  Please call patient.   I reviewed the bloodwork. Kidney function, liver function, electrolytes, thyroid, autoimmune screener, inflammation markers, chronic urticaria index (checks for autoantibodies that trigger mast cells), tryptase (checks for mast cell issues) and alpha gal (checks for red meat allergy) were all normal which is great.    No need to avoid any foods.     Your blood count showed elevates eosinophil counts which can be caused by many things. We need to recheck this as the last one I have in the system was drawn 8 years ago and that is not a good comparison.    We also need to recheck crp - which is a nonspecific acute inflammatory response marker.    At this point not sure if the above abnormal bloodwork is somehow contributing to your symptoms or not.   Please have patient repeat this bloodwork this week or next week - CBC diff and CRP. Ask if she wants [place] to mail the lab or she can come in to [place] office to have it drawn.   Thank you.

## 2021-11-08 NOTE — Telephone Encounter (Signed)
Dr. Kim please advise 

## 2021-11-10 NOTE — Telephone Encounter (Signed)
Spoke to patient and informed her of lab results and she will pick the new lab requisition up today from the [facility] office to have the CBC and CRP test redrawn.    [facility] [phone]

## 2021-11-11 LAB — CBC WITH DIFFERENTIAL/PLATELET
Basophils Absolute: 0.1 10*3/uL (ref 0.0–0.2)
Basos: 1 %
EOS (ABSOLUTE): 2.7 10*3/uL — ABNORMAL HIGH (ref 0.0–0.4)
Eos: 28 %
Hematocrit: 38.1 % (ref 34.0–46.6)
Hemoglobin: 12.6 g/dL (ref 11.1–15.9)
Immature Grans (Abs): 0 10*3/uL (ref 0.0–0.1)
Immature Granulocytes: 0 %
Lymphocytes Absolute: 2.7 10*3/uL (ref 0.7–3.1)
Lymphs: 28 %
MCH: 29.9 pg (ref 26.6–33.0)
MCHC: 33.1 g/dL (ref 31.5–35.7)
MCV: 90 fL (ref 79–97)
Monocytes Absolute: 0.9 10*3/uL (ref 0.1–0.9)
Monocytes: 9 %
Neutrophils Absolute: 3.4 10*3/uL (ref 1.4–7.0)
Neutrophils: 34 %
Platelets: 409 10*3/uL (ref 150–450)
RBC: 4.22 x10E6/uL (ref 3.77–5.28)
RDW: 12.7 % (ref 11.7–15.4)
WBC: 9.8 10*3/uL (ref 3.4–10.8)

## 2021-11-11 LAB — C-REACTIVE PROTEIN: CRP: 8 mg/L (ref 0–10)

## 2021-11-14 NOTE — Progress Notes (Signed)
Follow Up Note  RE: Sandra Oneill MRN: [id number] DOB: July 27, 1986 Date of Office Visit: 11/15/2021  Referring provider: Cyril Mourning, FNP Primary care provider: Cyril Mourning, FNP  Chief Complaint: Headache (One side of the head headache for over  aweek)  History of Present Illness: I had the pleasure of seeing Sandra Oneill for a follow up visit at the [facility] on 11/17/2021. She is a 36 y.o. female, who is being followed for urticaria, asthma. Her previous allergy office visit was on 10/27/2021 with Dr. Selena Batten. Today is a regular follow up visit.  Urticaria No additional outbreaks and has been off allegra the past few days.  Moderate persistent asthma  Still taking Advair 1 puff twice a day and rinsing mouth after each use. Did not start Symbicort yet but has it at home. Did not notice any thrush in the mouth.   Oral thrush Patient finished nystatin.   Abnormal labs - elevated eosinophils  Denies any fevers, chills, weight loss. Denies any skin issues, itching - but had hives in the past.  Cardiac - denies any chest pains, shortness of breath, palpitations. She does mention some posterior torso pain last week which got worse with twisting her torso.  Respiratory - denies any respiratory symptoms but has asthma.  ENT - some nasal congestion.  GI - patient had some abdominal pain and vomiting x 1, no loose stools.  Nervous - denies any behavioral changes, confusion, balance problems, memory loss, change in vision, numbness, weakness, pain. Some headaches.   Medications - patient is taking multivitamin, probiotic, elderberry, L-syine Diet - denies consuming any raw seafood. No recent travel. Patient works in an office.   Component     Latest Ref Rng & Units 10/27/2021 11/10/2021  WBC     3.4 - 10.8 x10E3/uL 10.6 9.8  RBC     3.77 - 5.28 x10E6/uL 4.15 4.22  Hemoglobin     11.1 - 15.9 g/dL 69.1 98.0  HCT     48.7 - 46.6 % 37.4 38.1  MCV     79  - 97 fL 90 90  MCH     26.6 - 33.0 pg 30.1 29.9  MCHC     31.5 - 35.7 g/dL 88.8 83.8  RDW     95.3 - 15.4 % 12.5 12.7  Platelets     150 - 450 x10E3/uL 359 409  Neutrophils     Not Estab. % 62 34  Lymphs     Not Estab. % 15 28  Monocytes     Not Estab. % 6 9  Eos     Not Estab. % 17 28  Basos     Not Estab. % 0 1  NEUT#     1.4 - 7.0 x10E3/uL 6.5 3.4  Lymphocyte #     0.7 - 3.1 x10E3/uL 1.5 2.7  Monocytes Absolute     0.1 - 0.9 x10E3/uL 0.7 0.9  EOS (ABSOLUTE)     0.0 - 0.4 x10E3/uL 1.8 (H) 2.7 (H)  Basophils Absolute     0.0 - 0.2 x10E3/uL 0.0 0.1  Immature Granulocytes     Not Estab. % 0 0  Immature Grans (Abs)     0.0 - 0.1 x10E3/uL 0.0 0.0   Assessment and Plan: Jazmene is a 36 y.o. female with: Peripheral eosinophilia Elevated eosinophils - 1800 then 2700 in January 2023. In March 2022 normal eosinophils levels. Denies any cardiac, hematologic, neurologic, vascular, renal issues.  She does have history of asthma, headaches and hives. One episode of emesis awhile ago. No prior history of eosinophilia. Denies any recent travel or new medications besides the nystatin that I prescribed for the oral thrush. Discussed with patient that will need to do some further work up regarding this.  Get Chest X-ray. Get bloodwork at the end of next week or the following week - will mail the orders to your home. Make sure to stop all extra OTC supplements.   Moderate persistent asthma without complication Past history - on Advair 250 mcg 1 puff twice a day for many years and uses albuterol on rare occasions.  Not sure of prior thrush diagnosis. 2023 spirometry was normal. Interim history - did not start Symbicort yet.  Daily controller medication(s): START Symbicort 51mcg 2 puffs twice a day with spacer and rinse mouth afterwards. This replaces Advair Diskus - hopefully you will get less thrush episodes. May use albuterol rescue inhaler 2 puffs every 4 to 6 hours as needed for  shortness of breath, chest tightness, coughing, and wheezing. May use albuterol rescue inhaler 2 puffs 5 to 15 minutes prior to strenuous physical activities. Monitor frequency of use.  Get spirometry at next visit.  Oral thrush Finished nystatin. Still has thrush on exam today. Take nystatin 79mL 4 times a day - swish and swallow. Make sure you rinse your mouth or brush your teeth after using your inhalers to prevent thrush.  Urticaria Past history - 1 episode of hive outbreak after taking a bath.  Prednisone and Benadryl eventually resolved symptoms.  Denies any changes in diet, medication personal care products.  Questionable infection as she had a fever but negative flu/COVID/strep testing in the ER. 5 years ago had hives with no known triggers. Interim history - no additional hives. 2023 Bloodwork (CMP, TSH, ANA, ESR, CRP, CU, tryptase, Alpha gal) all normal.  Keep track of symptoms and take pictures. During a hive flare up:  Start zyrtec (cetirizine) 10mg  twice a day. If symptoms are not controlled or causes drowsiness let us know. Start Pepcid (famotidine) 20mg  twice a day.  Avoid the following potential triggers: alcohol, tight clothing, NSAIDs, hot showers and getting overheated.  Generalized headache Monitor your blood pressure Get eye exam If still persistent, follow up with your PCP regarding this.   Return in about 3 months (around 02/12/2022).  Meds ordered this encounter  Medications   nystatin (MYCOSTATIN) 100000 UNIT/ML suspension    Sig: Take 5 mLs (500,000 Units total) by mouth 4 (four) times daily. Swish and swallow    Dispense:  60 mL    Refill:  0   Lab Orders         Ova and parasite examination         CBC With Differential         Pathologist smear review         ANCA Profile         Vitamin B12         HIV Antibody (routine testing w rflx)         IgE         Strongyloides, Ab, IgG         Urinalysis         Troponin T       Diagnostics: None.  Medication List:  Current Outpatient Medications  Medication Sig Dispense Refill   albuterol (PROAIR HFA) 108 (90 Base) MCG/ACT inhaler INHALE TWO PUFFS BY MOUTH EVERY 6  HOURS AS NEEDED FOR SHORTNESS OF BREATH 9 each 6   diphenhydrAMINE (BENADRYL) 25 MG tablet Take 1 tablet (25 mg total) by mouth every 6 (six) hours as needed. 30 tablet 0   fexofenadine (ALLEGRA ALLERGY) 180 MG tablet Take 1 tablet (180 mg total) by mouth daily. 30 tablet 3   budesonide-formoterol (SYMBICORT) 80-4.5 MCG/ACT inhaler Inhale 2 puffs into the lungs in the morning and at bedtime. with spacer and rinse mouth afterwards. (Patient not taking: Reported on 11/15/2021) 1 each 5   famotidine (PEPCID) 20 MG tablet Take 1 tablet (20 mg total) by mouth 2 (two) times daily. (Patient not taking: Reported on 11/15/2021) 30 tablet 0   nystatin (MYCOSTATIN) 100000 UNIT/ML suspension Take 5 mLs (500,000 Units total) by mouth 4 (four) times daily. Swish and swallow 60 mL 0   No current facility-administered medications for this visit.   Allergies: Allergies  Allergen Reactions   Amoxicillin     REACTION: Nose bleeds and dizziness   Sulfamethoxazole-Trimethoprim     REACTION: itching or arms, legs, and lips as well as tingling of lips   Tomato Hives and Rash    RAW TOMATO   I reviewed her past medical history, social history, family history, and environmental history and no significant changes have been reported from her previous visit.  Review of Systems  Constitutional:  Negative for appetite change, chills, fever and unexpected weight change.  HENT:  Negative for congestion and rhinorrhea.   Eyes:  Negative for itching.  Respiratory:  Negative for cough, chest tightness, shortness of breath and wheezing.   Cardiovascular:  Negative for chest pain.  Gastrointestinal:  Negative for abdominal pain.  Genitourinary:  Negative for difficulty urinating.  Skin:  Negative for rash.  Neurological:   Positive for headaches.   Objective: BP 120/80    Pulse 84    Temp 98.4 F (36.9 C) (Temporal)    Resp 18    LMP 10/18/2021 (Exact Date)    SpO2 97%  There is no height or weight on file to calculate BMI. Physical Exam Vitals and nursing note reviewed.  Constitutional:      Appearance: Normal appearance. She is well-developed.  HENT:     Head: Normocephalic and atraumatic.     Right Ear: Tympanic membrane and external ear normal.     Left Ear: Tympanic membrane and external ear normal.     Nose: Nose normal.     Mouth/Throat:     Mouth: Mucous membranes are moist.     Comments: Some white spots on the soft palate. Eyes:     Conjunctiva/sclera: Conjunctivae normal.  Cardiovascular:     Rate and Rhythm: Normal rate and regular rhythm.     Heart sounds: Normal heart sounds. No murmur heard.   No friction rub. No gallop.  Pulmonary:     Effort: Pulmonary effort is normal.     Breath sounds: Normal breath sounds. No wheezing, rhonchi or rales.  Musculoskeletal:     Cervical back: Neck supple.  Skin:    General: Skin is warm.     Findings: No rash.  Neurological:     Mental Status: She is alert and oriented to person, place, and time.  Psychiatric:        Behavior: Behavior normal.   Previous notes and tests were reviewed. The plan was reviewed with the patient/family, and all questions/concerned were addressed.  It was my pleasure to see Neeva today and participate in her care. Please feel free  to contact me with any questions or concerns.  Sincerely,  Rexene Alberts, DO Allergy & Immunology  [facility] office: [place] office: [phone]

## 2021-11-15 ENCOUNTER — Other Ambulatory Visit: Payer: Self-pay

## 2021-11-15 ENCOUNTER — Encounter: Payer: Self-pay | Admitting: Allergy

## 2021-11-15 ENCOUNTER — Ambulatory Visit
Admission: RE | Admit: 2021-11-15 | Discharge: 2021-11-15 | Disposition: A | Discharge status: Home / Self Care | Payer: 59 | Source: Ambulatory Visit | Attending: Allergy | Admitting: Allergy

## 2021-11-15 ENCOUNTER — Ambulatory Visit: Payer: 59 | Admitting: Allergy

## 2021-11-15 VITALS — BP 120/80 | HR 84 | Temp 98.4°F | Resp 18

## 2021-11-15 DIAGNOSIS — D7219 Other eosinophilia: Secondary | ICD-10-CM

## 2021-11-15 DIAGNOSIS — B370 Candidal stomatitis: Secondary | ICD-10-CM

## 2021-11-15 DIAGNOSIS — J4540 Moderate persistent asthma, uncomplicated: Secondary | ICD-10-CM

## 2021-11-15 DIAGNOSIS — L509 Urticaria, unspecified: Secondary | ICD-10-CM | POA: Diagnosis not present

## 2021-11-15 DIAGNOSIS — R519 Headache, unspecified: Secondary | ICD-10-CM | POA: Diagnosis not present

## 2021-11-15 MED ORDER — NYSTATIN 100000 UNIT/ML MT SUSP: 5.0000 mL | Freq: Four times a day (QID) | OROMUCOSAL | 0 refills | Status: DC

## 2021-11-15 NOTE — Patient Instructions (Addendum)
Elevated eosinophils: Get Chest X-ray. Get bloodwork at the end of next week or the following week - will mail the orders to your home. Make sure you have been off all your supplements.  We are ordering labs, so please allow 1-2 weeks for the results to come back. With the newly implemented Cures Act, the labs might be visible to you at the same time that they become visible to me. However, I will not address the results until all of the results are back, so please be patient.  In the meantime, continue recommendations in your patient instructions, including avoidance measures (if applicable), until you hear from me.  Hives: Keep track of symptoms and take pictures. During a hive flare up:  Start zyrtec (cetirizine) 10mg  twice a day. If symptoms are not controlled or causes drowsiness let know. Start pepcid (famotidine) 20mg  twice a day.  Avoid the following potential triggers: alcohol, tight clothing, NSAIDs, hot showers and getting overheated.  Asthma: Daily controller medication(s): START Symbicort [place] 2 puffs twice a day with spacer and rinse mouth afterwards. This replaces Advair Diskus - hopefully you will get less thrush episodes. May use albuterol rescue inhaler 2 puffs every 4 to 6 hours as needed for shortness of breath, chest tightness, coughing, and wheezing. May use albuterol rescue inhaler 2 puffs 5 to 15 minutes prior to strenuous physical activities. Monitor frequency of use.  Asthma control goals:  Full participation in all desired activities (may need albuterol before activity) Albuterol use two times or less a week on average (not counting use with activity) Cough interfering with sleep two times or less a month Oral steroids no more than once a year No hospitalizations   Oral thrush: Take nystatin 59mL 4 times a day - swish and swallow. Make sure you rinse your mouth or brush your teeth after using your inhalers to prevent thrush.  Headaches Monitor your blood  pressure Get eye exam If still persistent, follow up with your PCP regarding this.   Follow up - keep May appointment for now.

## 2021-11-17 ENCOUNTER — Encounter: Payer: Self-pay | Admitting: Allergy

## 2021-11-17 DIAGNOSIS — D7219 Other eosinophilia: Secondary | ICD-10-CM | POA: Insufficient documentation

## 2021-11-17 DIAGNOSIS — R519 Headache, unspecified: Secondary | ICD-10-CM | POA: Insufficient documentation

## 2021-11-17 NOTE — Assessment & Plan Note (Signed)
Elevated eosinophils - 1800 then 2700 in January 2023. In March 2022 normal eosinophils levels. Denies any cardiac, hematologic, neurologic, vascular, renal issues. She does have history of asthma, headaches and hives. One episode of emesis awhile ago. No prior history of eosinophilia. Denies any recent travel or new medications besides the nystatin that I prescribed for the oral thrush.  Discussed with patient that will need to do some further work up regarding this.   Get Chest X-ray.  Get bloodwork at the end of next week or the following week - will mail the orders to your home. o Make sure to stop all extra OTC supplements.

## 2021-11-17 NOTE — Assessment & Plan Note (Signed)
Past history - 1 episode of hive outbreak after taking a bath.  Prednisone and Benadryl eventually resolved symptoms.  Denies any changes in diet, medication personal care products.  Questionable infection as she had a fever but negative flu/COVID/strep testing in the ER. 5 years ago had hives with no known triggers. Interim history - no additional hives. 2023 Bloodwork (CMP, TSH, ANA, ESR, CRP, CU, tryptase, Alpha gal) all normal.   Keep track of symptoms and take pictures.  During a hive flare up:   Start zyrtec (cetirizine) 10mg  twice a day.  If symptoms are not controlled or causes drowsiness let us know.  Start Pepcid (famotidine) 20mg  twice a day.   Avoid the following potential triggers: alcohol, tight clothing, NSAIDs, hot showers and getting overheated.

## 2021-11-17 NOTE — Assessment & Plan Note (Signed)
Finished nystatin. Still has thrush on exam today.  Take nystatin 42mL 4 times a day - swish and swallow.  Make sure you rinse your mouth or brush your teeth after using your inhalers to prevent thrush.

## 2021-11-17 NOTE — Assessment & Plan Note (Signed)
Past history - on Advair 250 mcg 1 puff twice a day for many years and uses albuterol on rare occasions.  Not sure of prior thrush diagnosis. 2023 spirometry was normal. Interim history - did not start Symbicort yet.   Daily controller medication(s): START Symbicort 2 puffs twice a day with spacer and rinse mouth afterwards. o This replaces Advair Diskus - hopefully you will get less thrush episodes.  May use albuterol rescue inhaler 2 puffs every 4 to 6 hours as needed for shortness of breath, chest tightness, coughing, and wheezing. May use albuterol rescue inhaler 2 puffs 5 to 15 minutes prior to strenuous physical activities. Monitor frequency of use.   Get spirometry at next visit.

## 2021-11-17 NOTE — Assessment & Plan Note (Signed)
•   Monitor your blood pressure  Get eye exam  If still persistent, follow up with your PCP regarding this.

## 2021-11-28 LAB — PATHOLOGIST SMEAR REVIEW
Basophils Absolute: 0.1 10*3/uL (ref 0.0–0.2)
Basos: 1 %
EOS (ABSOLUTE): 0.9 10*3/uL — ABNORMAL HIGH (ref 0.0–0.4)
Eos: 15 %
Hematocrit: 38.9 % (ref 34.0–46.6)
Hemoglobin: 12.8 g/dL (ref 11.1–15.9)
Immature Grans (Abs): 0 10*3/uL (ref 0.0–0.1)
Immature Granulocytes: 0 %
Lymphocytes Absolute: 3.1 10*3/uL (ref 0.7–3.1)
Lymphs: 52 %
MCH: 29.7 pg (ref 26.6–33.0)
MCHC: 32.9 g/dL (ref 31.5–35.7)
MCV: 90 fL (ref 79–97)
Monocytes Absolute: 0.4 10*3/uL (ref 0.1–0.9)
Monocytes: 7 %
Neutrophils Absolute: 1.5 10*3/uL (ref 1.4–7.0)
Neutrophils: 25 %
Platelets: 470 10*3/uL — ABNORMAL HIGH (ref 150–450)
RBC: 4.31 x10E6/uL (ref 3.77–5.28)
RDW: 12.5 % (ref 11.7–15.4)
WBC: 6 10*3/uL (ref 3.4–10.8)

## 2021-12-01 ENCOUNTER — Telehealth: Payer: Self-pay | Admitting: Allergy

## 2021-12-01 LAB — OVA AND PARASITE EXAMINATION

## 2021-12-01 LAB — CBC WITH DIFFERENTIAL
Basophils Absolute: 0.1 10*3/uL (ref 0.0–0.2)
Basos: 1 %
EOS (ABSOLUTE): 0.9 10*3/uL — ABNORMAL HIGH (ref 0.0–0.4)
Eos: 14 %
Hematocrit: 38.6 % (ref 34.0–46.6)
Hemoglobin: 12.7 g/dL (ref 11.1–15.9)
Immature Grans (Abs): 0 10*3/uL (ref 0.0–0.1)
Immature Granulocytes: 0 %
Lymphocytes Absolute: 3.1 10*3/uL (ref 0.7–3.1)
Lymphs: 53 %
MCH: 29.7 pg (ref 26.6–33.0)
MCHC: 32.9 g/dL (ref 31.5–35.7)
MCV: 90 fL (ref 79–97)
Monocytes Absolute: 0.3 10*3/uL (ref 0.1–0.9)
Monocytes: 6 %
Neutrophils Absolute: 1.5 10*3/uL (ref 1.4–7.0)
Neutrophils: 26 %
RBC: 4.27 x10E6/uL (ref 3.77–5.28)
RDW: 12.5 % (ref 11.7–15.4)
WBC: 5.9 10*3/uL (ref 3.4–10.8)

## 2021-12-01 LAB — HIV ANTIBODY (ROUTINE TESTING W REFLEX): HIV Screen 4th Generation wRfx: NONREACTIVE

## 2021-12-01 LAB — URINALYSIS
Bilirubin, UA: NEGATIVE
Glucose, UA: NEGATIVE
Leukocytes,UA: NEGATIVE
Nitrite, UA: NEGATIVE
Protein,UA: NEGATIVE
Specific Gravity, UA: 1.024 (ref 1.005–1.030)
Urobilinogen, Ur: 0.2 mg/dL (ref 0.2–1.0)
pH, UA: 5.5 (ref 5.0–7.5)

## 2021-12-01 LAB — ANCA PROFILE
Anti-MPO Antibodies: <0.2 units (ref 0.0–0.9)
Anti-PR3 Antibodies: <0.2 units (ref 0.0–0.9)
Atypical pANCA: <1:20 {titer}
C-ANCA: <1:20 {titer}
P-ANCA: <1:20 {titer}

## 2021-12-01 LAB — TROPONIN T: Troponin T (Highly Sensitive): <6 ng/L (ref 0–14)

## 2021-12-01 LAB — VITAMIN B12: Vitamin B-12: 892 pg/mL (ref 232–1245)

## 2021-12-01 LAB — STRONGYLOIDES, AB, IGG: Strongyloides, Ab, IgG: NEGATIVE

## 2021-12-01 LAB — IGE: IgE (Immunoglobulin E), Serum: 722 IU/mL — ABNORMAL HIGH (ref 6–495)

## 2021-12-01 NOTE — Telephone Encounter (Signed)
Spoke with patient and reviewed lab results. Eosinophils came back to 900. Rest of lab unremarkable.   Nothing to recheck for now.

## 2021-12-24 ENCOUNTER — Emergency Department (HOSPITAL_COMMUNITY)
Admission: EM | Admit: 2021-12-24 | Discharge: 2021-12-24 | Disposition: A | Discharge status: Home / Self Care | Payer: 59 | Attending: Emergency Medicine | Admitting: Emergency Medicine

## 2021-12-24 ENCOUNTER — Encounter (HOSPITAL_COMMUNITY): Payer: Self-pay | Admitting: *Deleted

## 2021-12-24 ENCOUNTER — Other Ambulatory Visit: Payer: Self-pay

## 2021-12-24 DIAGNOSIS — J45909 Unspecified asthma, uncomplicated: Secondary | ICD-10-CM | POA: Insufficient documentation

## 2021-12-24 DIAGNOSIS — Y92410 Unspecified street and highway as the place of occurrence of the external cause: Secondary | ICD-10-CM | POA: Insufficient documentation

## 2021-12-24 DIAGNOSIS — S60221A Contusion of right hand, initial encounter: Secondary | ICD-10-CM | POA: Insufficient documentation

## 2021-12-24 DIAGNOSIS — Z87891 Personal history of nicotine dependence: Secondary | ICD-10-CM | POA: Insufficient documentation

## 2021-12-24 NOTE — Discharge Instructions (Signed)
You were evaluated in the Emergency Department and after careful evaluation, we did not find any emergent condition requiring admission or further testing in the hospital. ? ?Your exam/testing today was overall reassuring.  Recommend Tylenol or Motrin for any lingering pain or discomfort.  You will likely be more sore tomorrow. ? ?Please return to the Emergency Department if you experience any worsening of your condition.  Thank you for allowing [place] to be a part of your care. ? ?
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
## 2022-02-14 ENCOUNTER — Ambulatory Visit: Payer: 59 | Admitting: Allergy

## 2022-02-14 ENCOUNTER — Encounter: Payer: Self-pay | Admitting: Allergy

## 2022-02-14 VITALS — BP 122/80 | HR 70 | Temp 98.1°F | Resp 18 | Ht 62.0 in | Wt 145.8 lb

## 2022-02-14 DIAGNOSIS — L509 Urticaria, unspecified: Secondary | ICD-10-CM

## 2022-02-14 DIAGNOSIS — B370 Candidal stomatitis: Secondary | ICD-10-CM

## 2022-02-14 DIAGNOSIS — D7219 Other eosinophilia: Secondary | ICD-10-CM | POA: Diagnosis not present

## 2022-02-14 DIAGNOSIS — R0981 Nasal congestion: Secondary | ICD-10-CM | POA: Insufficient documentation

## 2022-02-14 DIAGNOSIS — J4540 Moderate persistent asthma, uncomplicated: Secondary | ICD-10-CM

## 2022-02-14 DIAGNOSIS — R519 Headache, unspecified: Secondary | ICD-10-CM

## 2022-02-14 IMAGING — CR DG CHEST 2V
2 series · 2 of 2 positions shown · non-contrast
Comparison: None.

CLINICAL DATA: 35-year-old female with asthma

EXAM:
CHEST - 2 VIEW

[w chest pa]
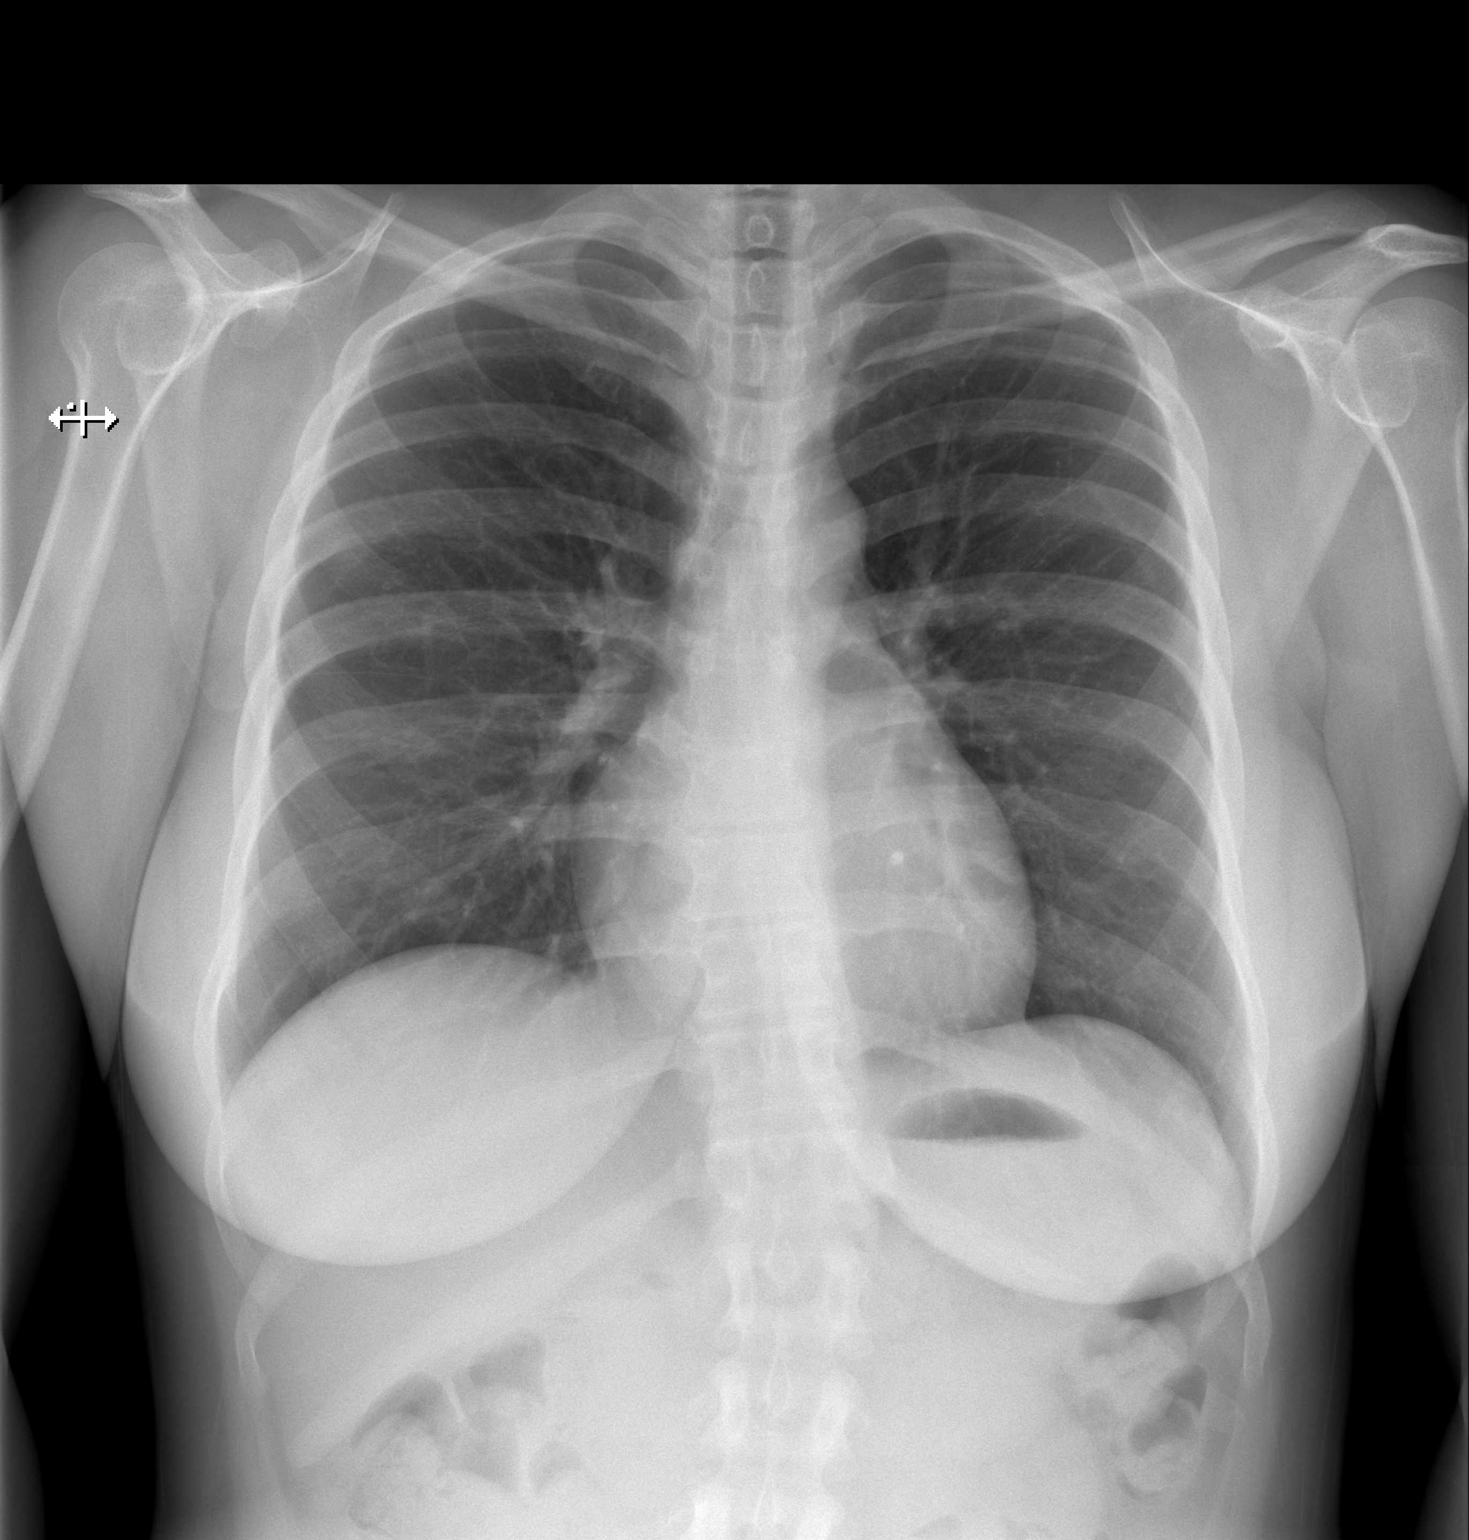

[w chest lat]
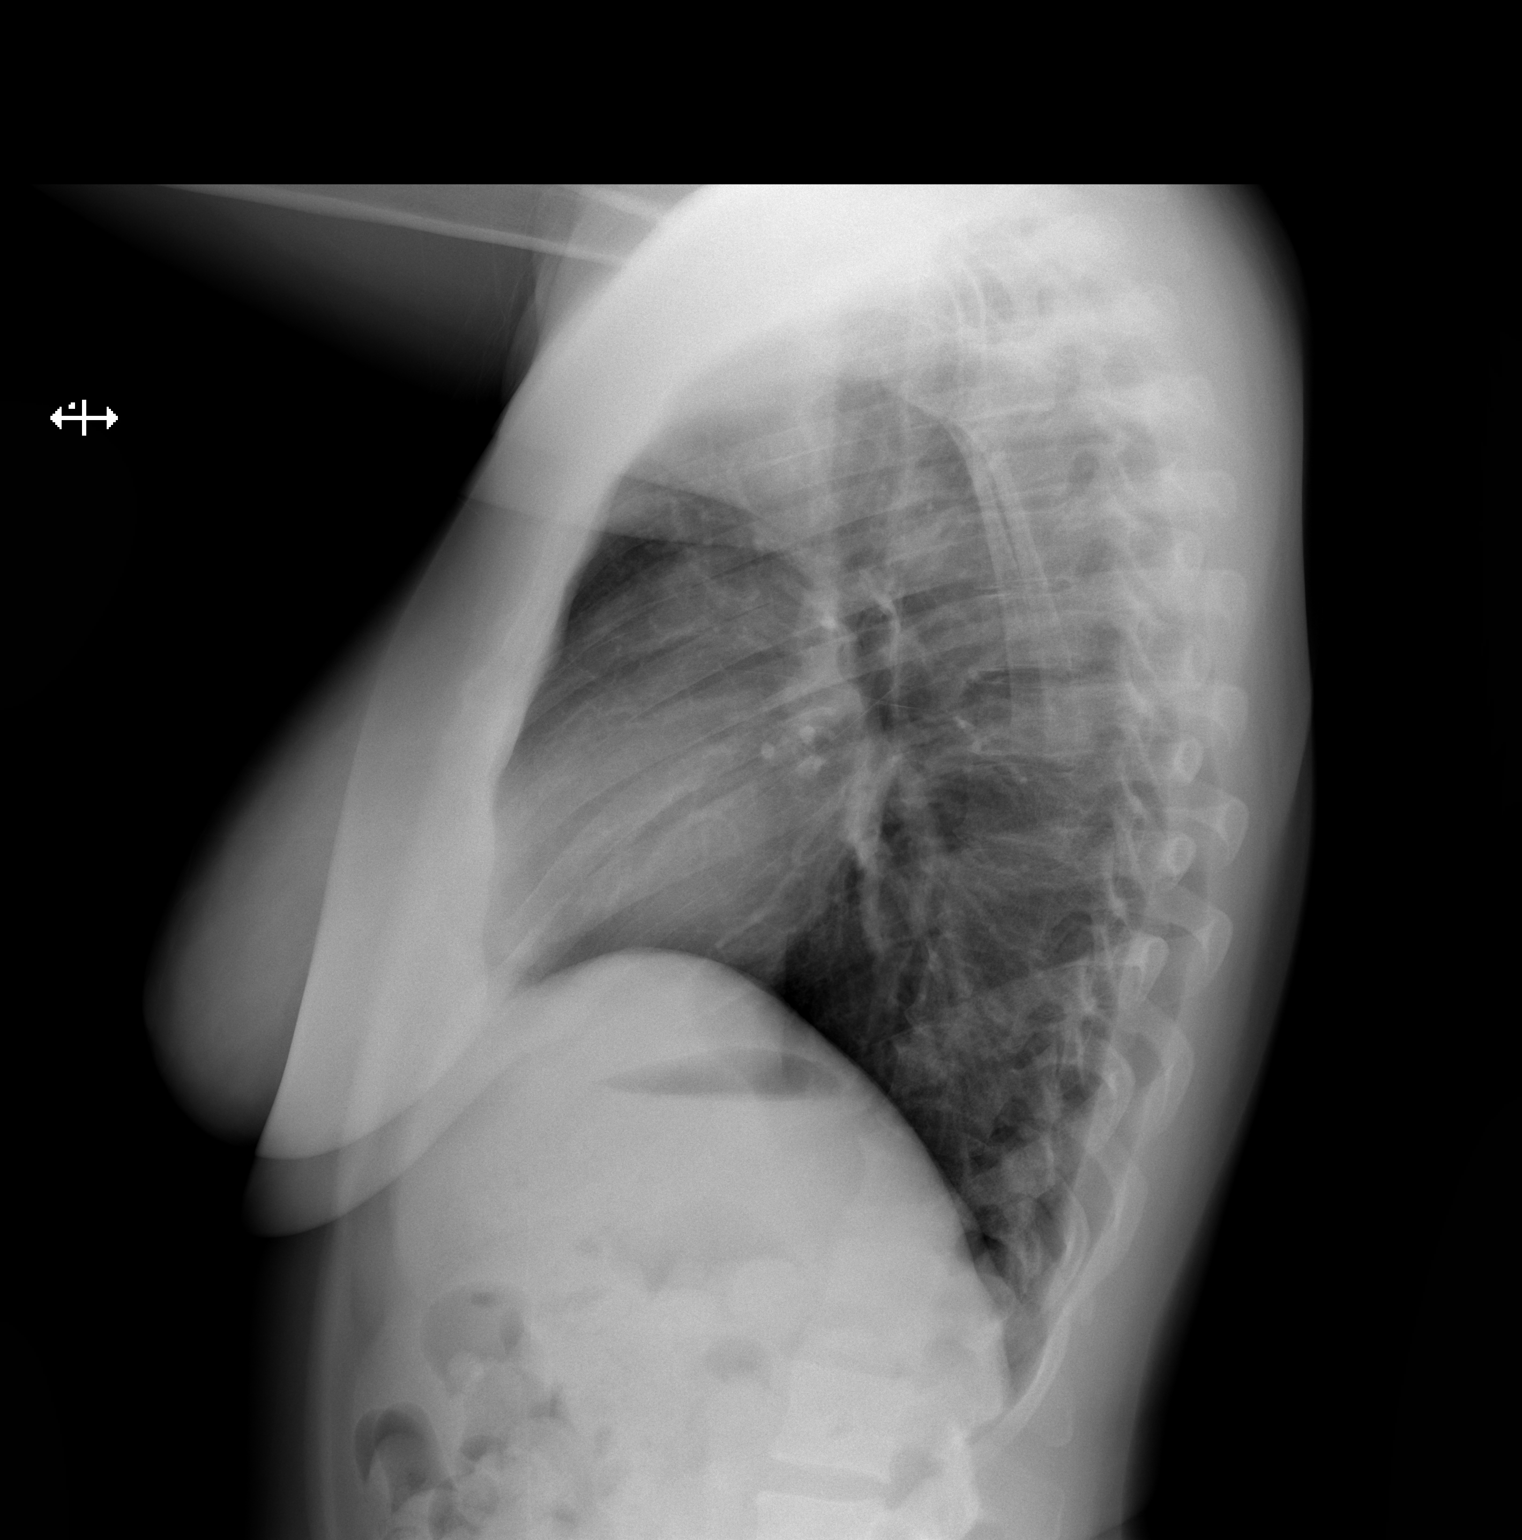

[2 of 2 positions shown; findings below may reference images not displayed]

FINDINGS: Cardiomediastinal silhouette within normal limits in size and
contour. No evidence of central vascular congestion. No interlobular
septal thickening. No pneumothorax or pleural effusion. No confluent
airspace disease.

Mild scoliotic curvature of the spine

No acute displaced fracture
IMPRESSION: No active cardiopulmonary disease.

## 2022-02-14 MED ORDER — NYSTATIN 100000 UNIT/ML MT SUSP: 5.0000 mL | Freq: Four times a day (QID) | OROMUCOSAL | 0 refills | Status: DC

## 2022-02-14 MED ORDER — CARBINOXAMINE MALEATE 4 MG PO TABS: ORAL_TABLET | ORAL | 1 refills | Status: DC

## 2022-02-14 NOTE — Progress Notes (Signed)
? ?Follow Up Note ? ?RE: Sandra Oneill MRN: [id number] DOB: 23-Jun-1986 ?Date of Office Visit: 02/14/2022 ? ?Referring provider: Teodora Medici, FNP ?Primary care provider: Teodora Medici, FNP ? ?Chief Complaint: Urticaria (No issues /), Asthma (No issues ), and Allergic Rhinitis  (Congestion, stuffy nos, itchy watery eyes, and thick mucus clear/ yellow-  feel like a sinus infection) ? ?History of Present Illness: ?I had the pleasure of seeing Sandra Oneill for a follow up visit at the [facility] on 02/14/2022. She is a 36 y.o. Oneill, who is being followed for peripheral eosinophilia, asthma, urticaria, headache. Her previous allergy office visit was on 11/15/2021 with Dr. Maudie Mercury. Today is a regular follow up visit. ? ?Peripheral eosinophilia ?Eos 900 on 11/25/21 and rest of work up was negative.  ?  ?Moderate persistent asthma ?Currently on Symbicort 60mg 2 puffs twice a day with good benefit. No flare in symptoms in forgets to take twice a day. ? ?Using albuterol less than once per week.  ? ?Urticaria ?No more hive since the last visit.  ?  ?Generalized headache ?Did not get eyes checked. ?Still has some headaches at times.  ? ?Other ?Nasal congestion, voice changes, ear fullness x 3 weeks. ?Tried generic afrin and antihistamines with no benefit. ?Minimal symptoms last year during the spring. Questioning if she's developing environmental allergies.  ? ?Assessment and Plan: ?Sandra a 36y.o. Oneill with: ?Moderate persistent asthma without complication ?Past history - on Advair 250 mcg 1 puff twice a day for many years and uses albuterol on rare occasions.  Not sure of prior thrush diagnosis. 2023 spirometry was normal. ?Interim history - no change in symptoms with Symbicort.  ?Today's spirometry was normal.  ?Daily controller medication(s): Decrease Symbicort 856m 2 puffs to ONCE a day with spacer and rinse mouth afterwards. ?If you notice symptoms then go back up to twice a day.  ?During  upper respiratory infections/flares:  ?Start Symbicort 8033m2 puffs TWICE a day for 1-2 weeks until your breathing symptoms return to baseline.  ?May use albuterol rescue inhaler 2 puffs or nebulizer every 4 to 6 hours as needed for shortness of breath, chest tightness, coughing, and wheezing. May use albuterol rescue inhaler 2 puffs 5 to 15 minutes prior to strenuous physical activities. Monitor frequency of use.  ?Get spirometry at next visit. ? ?Oral thrush ?Thrush on exam today again.  ?Take nystatin 5mL76mtimes a day - swish and swallow. ?Stressed importance of rinsing mouth after ICS inhaler use.  ? ?Urticaria ?Past history - 1 episode of hive outbreak after taking a bath.  Prednisone and Benadryl eventually resolved symptoms.  Denies any changes in diet, medication personal care products.  Questionable infection as she had a fever but negative flu/COVID/strep testing in the ER. 5 years ago had hives with no known triggers. 2023 Bloodwork (CMP, TSH, ANA, ESR, CRP, CU, tryptase, Alpha gal) all normal.  ?Interim history - no additional hives.  ?Keep track of symptoms and take pictures. ?During a hive flare up:  ?Start zyrtec (cetirizine) 10mg64mce a day. ?If symptoms are not controlled or causes drowsiness [place] us [place]. ?Start Pepcid (famotidine) 20mg 25me a day.  ?Avoid the following potential triggers: alcohol, tight clothing, NSAIDs, hot showers and getting overheated. ? ?Generalized headache ?Unchanged.  ?Blood pressure - stable.  ?Get eye exam ?If still persistent, follow up with your PCP regarding this.  ? ?Peripheral eosinophilia ?Past history - Elevated eosinophils - 1800  then 2700 in January 2023. In March 2022 normal eosinophils levels. Denies any cardiac, hematologic, neurologic, vascular, renal issues. She does have history of asthma, headaches and hives. One episode of emesis awhile ago. No prior history of eosinophilia. Denies any recent travel or new medications besides the nystatin that I  prescribed for the oral thrush. ?Interim history - 2023 bloodwork (UA, ANA, HIV, O&P, strongyloides, trop, B12),CXR all unremarkable. Eos 900. ?No additional work up needed as eos trended down. ? ?Nasal congestion ?Nasal congestion x 3 weeks. Tried afrin and antihistamines with minimal benefit. No prior environmental allergy testing. ?Start Ryaltris (olopatadine + mometasone nasal spray combination) 1-2 sprays per nostril twice a day. Sample given. ?If this works well for you let me know and I will send in a prescription. ?May take carbinoxamine 97m to 862m2-3 times a day as needed.  ?Nasal saline spray (i.e., Simply Saline) or nasal saline lavage (i.e., NeilMed) is recommended as needed and prior to medicated nasal sprays. ?Consider environmental allergy testing at next visit.  ? ?Return in about 3 months (around 05/17/2022) for Skin testing. ? ?Meds ordered this encounter  ?Medications  ? Carbinoxamine Maleate 4 MG TABS  ?  Sig: Take 1-2 tablets 2-3 times a day as needed for allergies.  ?  Dispense:  90 tablet  ?  Refill:  1  ? nystatin (MYCOSTATIN) 100000 UNIT/ML suspension  ?  Sig: Take 5 mLs (500,000 Units total) by mouth 4 (four) times daily. Swish and swallow.  ?  Dispense:  60 mL  ?  Refill:  0  ? ?Lab Orders  ?No laboratory test(s) ordered today  ? ? ?Diagnostics: ?Spirometry:  ?Tracings reviewed. Her effort: Good reproducible efforts. ?FVC: 3.31L ?FEV1: 2.56L, 105% predicted ?FEV1/FVC ratio: 77% ?Interpretation: Spirometry consistent with normal pattern.  ?Please see scanned spirometry results for details. ? ?Medication List:  ?Current Outpatient Medications  ?Medication Sig Dispense Refill  ? albuterol (PROAIR HFA) 108 (90 Base) MCG/ACT inhaler INHALE TWO PUFFS BY MOUTH EVERY 6 HOURS AS NEEDED FOR SHORTNESS OF BREATH 9 each 6  ? budesonide-formoterol (SYMBICORT) 80-4.5 MCG/ACT inhaler Inhale 2 puffs into the lungs in the morning and at bedtime. with spacer and rinse mouth afterwards. 1 each 5  ?  Carbinoxamine Maleate 4 MG TABS Take 1-2 tablets 2-3 times a day as needed for allergies. 90 tablet 1  ? diphenhydrAMINE (BENADRYL) 25 MG tablet Take 1 tablet (25 mg total) by mouth every 6 (six) hours as needed. 30 tablet 0  ? famotidine (PEPCID) 20 MG tablet Take 1 tablet (20 mg total) by mouth 2 (two) times daily. 30 tablet 0  ? fexofenadine (ALLEGRA ALLERGY) 180 MG tablet Take 1 tablet (180 mg total) by mouth daily. 30 tablet 3  ? nystatin (MYCOSTATIN) 100000 UNIT/ML suspension Take 5 mLs (500,000 Units total) by mouth 4 (four) times daily. Swish and swallow. 60 mL 0  ? ?No current facility-administered medications for this visit.  ? ?Allergies: ?Allergies  ?Allergen Reactions  ? Amoxicillin   ?  REACTION: Nose bleeds and dizziness  ? Sulfamethoxazole-Trimethoprim   ?  REACTION: itching or arms, legs, and lips as well as tingling of lips  ? Tomato Hives and Rash  ?  RAW TOMATO  ? ?I reviewed her past medical history, social history, family history, and environmental history and no significant changes have been reported from her previous visit. ? ?Review of Systems  ?Constitutional:  Negative for appetite change, chills, fever and unexpected weight change.  ?HENT:  Positive for congestion, postnasal drip, rhinorrhea and voice change.   ?Eyes:  Negative for itching.  ?Respiratory:  Negative for cough, chest tightness, shortness of breath and wheezing.   ?Cardiovascular:  Negative for chest pain.  ?Gastrointestinal:  Negative for abdominal pain.  ?Genitourinary:  Negative for difficulty urinating.  ?Skin:  Negative for rash.  ?Neurological:  Positive for headaches.  ? ?Objective: ?BP 122/80   Pulse 70   Temp 98.1 ?F (36.7 ?C)   Resp 18   Ht 5' 2" (1.575 m)   Wt 145 lb 12.8 oz (66.1 kg)   SpO2 97%   BMI 26.67 kg/m?  ?Body mass index is 26.67 kg/m?Marland Kitchen ?Physical Exam ?Vitals and nursing note reviewed.  ?Constitutional:   ?   Appearance: Normal appearance. She is well-developed.  ?HENT:  ?   Head: Normocephalic  and atraumatic.  ?   Right Ear: Tympanic membrane and external ear normal.  ?   Left Ear: Tympanic membrane and external ear normal.  ?   Nose: Nose normal.  ?   Mouth/Throat:  ?   Mouth: Mucous membranes are moist.  ?   Comme

## 2022-02-14 NOTE — Patient Instructions (Addendum)
Asthma: ?Daily controller medication(s): Decrease Symbicort 2 puffs to ONCE a day with spacer and rinse mouth afterwards. ?If you notice symptoms then go back up to twice a day.  ?During upper respiratory infections/flares:  ?Start Symbicort 2 puffs TWICE a day for 1-2 weeks until your breathing symptoms return to baseline.  ?May use albuterol rescue inhaler 2 puffs or nebulizer every 4 to 6 hours as needed for shortness of breath, chest tightness, coughing, and wheezing. May use albuterol rescue inhaler 2 puffs 5 to 15 minutes prior to strenuous physical activities. Monitor frequency of use.  ?Asthma control goals:  ?Full participation in all desired activities (may need albuterol before activity) ?Albuterol use two times or less a week on average (not counting use with activity) ?Cough interfering with sleep two times or less a month ?Oral steroids no more than once a year ?No hospitalizations  ? ?Oral thrush ?Take nystatin swish and swallow four times a day.  ? ?Nasal symptoms. ?Start Ryaltris (olopatadine + mometasone nasal spray combination) 1-2 sprays per nostril twice a day. Sample given. ?If this works well for you let me know and I will send in a prescription. ?May take carbinoxamine 4mg  to 8mg  2-3 times a day as needed.  ?Nasal saline spray (i.e., Simply Saline) or nasal saline lavage (i.e., NeilMed) is recommended as needed and prior to medicated nasal sprays. ? ?Oral thrush ?Take nystatin swish and swallow four times a day.  ? ?Hives: ?Keep track of symptoms and take pictures. ?During a hive flare up:  ?Start zyrtec (cetirizine) 10mg  twice a day. ?If symptoms are not controlled or causes drowsiness let know. ?Start pepcid (famotidine) 20mg  twice a day.  ?Avoid the following potential triggers: alcohol, tight clothing, NSAIDs, hot showers and getting overheated. ? ?Headaches ?Monitor your blood pressure ?Get eye exam ? ?Follow up in 3 months - for potential skin testing for environmental  allergies. Must be off antihistamines for 3 days before.  ?

## 2022-02-14 NOTE — Assessment & Plan Note (Signed)
Unchanged.  ?? Blood pressure - stable.  ?? Get eye exam ?? If still persistent, follow up with your PCP regarding this.  ?

## 2022-02-14 NOTE — Assessment & Plan Note (Signed)
Thrush on exam today again.  ?? Take nystatin 76mL 4 times a day - swish and swallow. ?? Stressed importance of rinsing mouth after ICS inhaler use.  ?

## 2022-02-14 NOTE — Assessment & Plan Note (Signed)
Nasal congestion x 3 weeks. Tried afrin and antihistamines with minimal benefit. No prior environmental allergy testing. ?? Start Ryaltris (olopatadine + mometasone nasal spray combination) 1-2 sprays per nostril twice a day. Sample given. ?o If this works well for you let me know and I will send in a prescription. ?? May take carbinoxamine 4mg  to 8mg  2-3 times a day as needed.  ?? Nasal saline spray (i.e., Simply Saline) or nasal saline lavage (i.e., NeilMed) is recommended as needed and prior to medicated nasal sprays. ?? Consider environmental allergy testing at next visit.  ?

## 2022-02-14 NOTE — Assessment & Plan Note (Signed)
Past history - on Advair 250 mcg 1 puff twice a day for many years and uses albuterol on rare occasions.  Not sure of prior thrush diagnosis. 2023 spirometry was normal. ?Interim history - no change in symptoms with Symbicort.  ?? Today's spirometry was normal.  ?? Daily controller medication(s): Decrease Symbicort 68mcg 2 puffs to ONCE a day with spacer and rinse mouth afterwards. ?o If you notice symptoms then go back up to twice a day.  ?? During upper respiratory infections/flares:  ?? Start Symbicort 58mcg 2 puffs TWICE a day for 1-2 weeks until your breathing symptoms return to baseline.  ?? May use albuterol rescue inhaler 2 puffs or nebulizer every 4 to 6 hours as needed for shortness of breath, chest tightness, coughing, and wheezing. May use albuterol rescue inhaler 2 puffs 5 to 15 minutes prior to strenuous physical activities. Monitor frequency of use.  ?? Get spirometry at next visit. ?

## 2022-02-14 NOTE — Assessment & Plan Note (Addendum)
Past history - Elevated eosinophils - 1800 then 2700 in January 2023. In March 2022 normal eosinophils levels. Denies any cardiac, hematologic, neurologic, vascular, renal issues. She does have history of asthma, headaches and hives. One episode of emesis awhile ago. No prior history of eosinophilia. Denies any recent travel or new medications besides the nystatin that I prescribed for the oral thrush. ?Interim history - 2023 bloodwork (UA, ANA, HIV, O&P, strongyloides, trop, B12),CXR all unremarkable. Eos 900. ?? No additional work up needed as eos trended down. ?

## 2022-02-14 NOTE — Assessment & Plan Note (Signed)
Past history - 1 episode of hive outbreak after taking a bath.  Prednisone and Benadryl eventually resolved symptoms.  Denies any changes in diet, medication personal care products.  Questionable infection as she had a fever but negative flu/COVID/strep testing in the ER. 5 years ago had hives with no known triggers. 2023 Bloodwork (CMP, TSH, ANA, ESR, CRP, CU, tryptase, Alpha gal) all normal.  ?Interim history - no additional hives.  ?? Keep track of symptoms and take pictures. ?? During a hive flare up:  ?? Start zyrtec (cetirizine) 10mg  twice a day. ?? If symptoms are not controlled or causes drowsiness let us know. ?? Start Pepcid (famotidine) 20mg  twice a day.  ?? Avoid the following potential triggers: alcohol, tight clothing, NSAIDs, hot showers and getting overheated. ?

## 2022-04-24 ENCOUNTER — Other Ambulatory Visit: Payer: Self-pay | Admitting: Allergy

## 2022-05-17 NOTE — Progress Notes (Unsigned)
Follow Up Note  RE: Sandra Oneill MRN: [id number] DOB: 1986-05-26 Date of Office Visit: 05/18/2022  Referring provider: Teodora Medici, FNP Primary care provider: Teodora Medici, FNP  Chief Complaint: No chief complaint on file.  History of Present Illness: I had the pleasure of seeing Sandra Oneill for a follow up visit at the [facility] on 05/17/2022. She is a 36 y.o. female, who is being followed for asthma, urticaria, nasal congestion. Her previous allergy office visit was on 02/14/2022 with Dr. Maudie Mercury. Today is a skin testing and follow up visit.  Moderate persistent asthma without complication Past history - on Advair 250 mcg 1 puff twice a day for many years and uses albuterol on rare occasions.  Not sure of prior thrush diagnosis. 2023 spirometry was normal. Interim history - no change in symptoms with Symbicort.  Today's spirometry was normal.  Daily controller medication(s): Decrease Symbicort 11mcg 2 puffs to ONCE a day with spacer and rinse mouth afterwards. If you notice symptoms then go back up to twice a day.  During upper respiratory infections/flares:  Start Symbicort 53mcg 2 puffs TWICE a day for 1-2 weeks until your breathing symptoms return to baseline.  May use albuterol rescue inhaler 2 puffs or nebulizer every 4 to 6 hours as needed for shortness of breath, chest tightness, coughing, and wheezing. May use albuterol rescue inhaler 2 puffs 5 to 15 minutes prior to strenuous physical activities. Monitor frequency of use.  Get spirometry at next visit.   Oral thrush Thrush on exam today again.  Take nystatin 67mL 4 times a day - swish and swallow. Stressed importance of rinsing mouth after ICS inhaler use.    Urticaria Past history - 1 episode of hive outbreak after taking a bath.  Prednisone and Benadryl eventually resolved symptoms.  Denies any changes in diet, medication personal care products.  Questionable infection as she had a fever but  negative flu/COVID/strep testing in the ER. 5 years ago had hives with no known triggers. 2023 Bloodwork (CMP, TSH, ANA, ESR, CRP, CU, tryptase, Alpha gal) all normal.  Interim history - no additional hives.  Keep track of symptoms and take pictures. During a hive flare up:  Start zyrtec (cetirizine) 10mg  twice a day. If symptoms are not controlled or causes drowsiness let us know. Start Pepcid (famotidine) 20mg  twice a day.  Avoid the following potential triggers: alcohol, tight clothing, NSAIDs, hot showers and getting overheated.   Generalized headache Unchanged.  Blood pressure - stable.  Get eye exam If still persistent, follow up with your PCP regarding this.    Peripheral eosinophilia Past history - Elevated eosinophils - 1800 then 2700 in January 2023. In March 2022 normal eosinophils levels. Denies any cardiac, hematologic, neurologic, vascular, renal issues. She does have history of asthma, headaches and hives. One episode of emesis awhile ago. No prior history of eosinophilia. Denies any recent travel or new medications besides the nystatin that I prescribed for the oral thrush. Interim history - 2023 bloodwork (UA, ANA, HIV, O&P, strongyloides, trop, B12),CXR all unremarkable. Eos 900. No additional work up needed as eos trended down.   Nasal congestion Nasal congestion x 3 weeks. Tried afrin and antihistamines with minimal benefit. No prior environmental allergy testing. Start Ryaltris (olopatadine + mometasone nasal spray combination) 1-2 sprays per nostril twice a day. Sample given. If this works well for you let me know and I will send in a prescription. May take carbinoxamine 4mg  to  $'8mg'f$  2-3 times a day as needed.  Nasal saline spray (i.e., Simply Saline) or nasal saline lavage (i.e., NeilMed) is recommended as needed and prior to medicated nasal sprays. Consider environmental allergy testing at next visit.    Return in about 3 months (around 05/17/2022) for Skin testing.     Assessment and Plan: [name] is a 36 y.o. female with: No problem-specific Assessment & Plan notes found for this encounter.  No follow-ups on file.  No orders of the defined types were placed in this encounter.  Lab Orders  No laboratory test(s) ordered today    Diagnostics: Spirometry:  Tracings reviewed. Her effort: {Blank single:19197::"Good reproducible efforts.","It was hard to get consistent efforts and there is a question as to whether this reflects a maximal maneuver.","Poor effort, data can not be interpreted."} FVC: ***L FEV1: ***L, ***% predicted FEV1/FVC ratio: ***% Interpretation: {Blank single:19197::"Spirometry consistent with mild obstructive disease","Spirometry consistent with moderate obstructive disease","Spirometry consistent with severe obstructive disease","Spirometry consistent with possible restrictive disease","Spirometry consistent with mixed obstructive and restrictive disease","Spirometry uninterpretable due to technique","Spirometry consistent with normal pattern","No overt abnormalities noted given today's efforts"}.  Please see scanned spirometry results for details.  Skin Testing: {Blank single:19197::"Select foods","Environmental allergy panel","Environmental allergy panel and select foods","Food allergy panel","None","Deferred due to recent antihistamines use"}. *** Results discussed with patient/family.   Medication List:  Current Outpatient Medications  Medication Sig Dispense Refill  . albuterol (PROAIR HFA) 108 (90 Base) MCG/ACT inhaler INHALE TWO PUFFS BY MOUTH EVERY 6 HOURS AS NEEDED FOR SHORTNESS OF BREATH 9 each 6  . Carbinoxamine Maleate 4 MG TABS Take 1-2 tablets 2-3 times a day as needed for allergies. 90 tablet 1  . diphenhydrAMINE (BENADRYL) 25 MG tablet Take 1 tablet (25 mg total) by mouth every 6 (six) hours as needed. 30 tablet 0  . famotidine (PEPCID) 20 MG tablet Take 1 tablet (20 mg total) by mouth 2 (two) times daily. 30 tablet  0  . fexofenadine (ALLEGRA ALLERGY) 180 MG tablet Take 1 tablet (180 mg total) by mouth daily. 30 tablet 3  . nystatin (MYCOSTATIN) 100000 UNIT/ML suspension Take 5 mLs (500,000 Units total) by mouth 4 (four) times daily. Swish and swallow. 60 mL 0  . SYMBICORT 80-4.5 MCG/ACT inhaler INHALE 2 PUFFS BY MOUTH IN THE MORNING AT BEDTIME AND RINSE MOUTH AFTER USE 11 g 0   No current facility-administered medications for this visit.   Allergies: Allergies  Allergen Reactions  . Amoxicillin     REACTION: Nose bleeds and dizziness  . Sulfamethoxazole-Trimethoprim     REACTION: itching or arms, legs, and lips as well as tingling of lips  . Tomato Hives and Rash    RAW TOMATO   I reviewed her past medical history, social history, family history, and environmental history and no significant changes have been reported from her previous visit.  Review of Systems  Constitutional:  Negative for appetite change, chills, fever and unexpected weight change.  HENT:  Positive for congestion, postnasal drip, rhinorrhea and voice change.   Eyes:  Negative for itching.  Respiratory:  Negative for cough, chest tightness, shortness of breath and wheezing.   Cardiovascular:  Negative for chest pain.  Gastrointestinal:  Negative for abdominal pain.  Genitourinary:  Negative for difficulty urinating.  Skin:  Negative for rash.  Neurological:  Positive for headaches.   Objective: There were no vitals taken for this visit. There is no height or weight on file to calculate BMI. Physical Exam Vitals and nursing note reviewed.  Constitutional:      Appearance: Normal appearance. She is well-developed.  HENT:     Head: Normocephalic and atraumatic.     Right Ear: Tympanic membrane and external ear normal.     Left Ear: Tympanic membrane and external ear normal.     Nose: Nose normal.     Mouth/Throat:     Mouth: Mucous membranes are moist.     Comments: Some white spots on the soft palate. Eyes:      Conjunctiva/sclera: Conjunctivae normal.  Cardiovascular:     Rate and Rhythm: Normal rate and regular rhythm.     Heart sounds: Normal heart sounds. No murmur heard.    No friction rub. No gallop.  Pulmonary:     Effort: Pulmonary effort is normal.     Breath sounds: Normal breath sounds. No wheezing, rhonchi or rales.  Musculoskeletal:     Cervical back: Neck supple.  Skin:    General: Skin is warm.     Findings: No rash.  Neurological:     Mental Status: She is alert and oriented to person, place, and time.  Psychiatric:        Behavior: Behavior normal.  Previous notes and tests were reviewed. The plan was reviewed with the patient/family, and all questions/concerned were addressed.  It was my pleasure to see Sandra Oneill today and participate in her care. Please feel free to contact me with any questions or concerns.  Sincerely,  Rexene Alberts, DO Allergy & Immunology  [facility] office: [place] office: [phone]

## 2022-05-18 ENCOUNTER — Ambulatory Visit: Payer: 59 | Admitting: Allergy

## 2022-05-18 ENCOUNTER — Encounter: Payer: Self-pay | Admitting: Allergy

## 2022-05-18 VITALS — BP 110/68 | HR 70 | Temp 98.1°F | Resp 16

## 2022-05-18 DIAGNOSIS — J3089 Other allergic rhinitis: Secondary | ICD-10-CM

## 2022-05-18 DIAGNOSIS — L509 Urticaria, unspecified: Secondary | ICD-10-CM | POA: Diagnosis not present

## 2022-05-18 DIAGNOSIS — L2389 Allergic contact dermatitis due to other agents: Secondary | ICD-10-CM | POA: Diagnosis not present

## 2022-05-18 DIAGNOSIS — J4540 Moderate persistent asthma, uncomplicated: Secondary | ICD-10-CM | POA: Diagnosis not present

## 2022-05-18 MED ORDER — RYALTRIS 665-25 MCG/ACT NA SUSP: 1.0000 | Freq: Two times a day (BID) | NASAL | 5 refills | Status: AC

## 2022-05-18 MED ORDER — MONTELUKAST SODIUM 10 MG PO TABS: 10.0000 mg | ORAL_TABLET | Freq: Every day | ORAL | 5 refills | Status: DC

## 2022-05-18 MED ORDER — BUDESONIDE-FORMOTEROL FUMARATE 80-4.5 MCG/ACT IN AERO: 2.0000 | INHALATION_SPRAY | Freq: Two times a day (BID) | RESPIRATORY_TRACT | 5 refills | Status: DC

## 2022-05-18 NOTE — Assessment & Plan Note (Signed)
Past history - on Advair 250 mcg 1 puff twice a day for many years and uses albuterol on rare occasions.  Not sure of prior thrush diagnosis. 2023 spirometry was normal. Interim history - no worsening symptoms since decreased dose.   Today's spirometry was normal.  . Daily controller medication(s): continue Symbicort 1-2 puffs ONCE a day with spacer and rinse mouth afterwards. . During upper respiratory infections/flares:  . Start Symbicort 2 puffs TWICE a day for 1-2 weeks until your breathing symptoms return to baseline.  . May use albuterol rescue inhaler 2 puffs or nebulizer every 4 to 6 hours as needed for shortness of breath, chest tightness, coughing, and wheezing. May use albuterol rescue inhaler 2 puffs 5 to 15 minutes prior to strenuous physical activities. Monitor frequency of use.

## 2022-05-18 NOTE — Assessment & Plan Note (Signed)
Past history - 1 episode of hive outbreak after taking a bath.  Prednisone and Benadryl eventually resolved symptoms.  Denies any changes in diet, medication personal care products.  Questionable infection as she had a fever but negative flu/COVID/strep testing in the ER. 5 years ago had hives with no known triggers. 2023 Bloodwork (CMP, TSH, ANA, ESR, CRP, CU, tryptase, Alpha gal) all normal.  Interim history - no additional hives.   Keep track of symptoms and take pictures.  During a hive flare up:   Start zyrtec (cetirizine) 10mg  twice a day.  If symptoms are not controlled or causes drowsiness let us know.  Start Pepcid (famotidine) 20mg  twice a day.  . Avoid the following potential triggers: alcohol, tight clothing, NSAIDs, hot showers and getting overheated.

## 2022-05-18 NOTE — Assessment & Plan Note (Signed)
Worse symptoms since off antihistamines. Ryaltris helped. Dog at home. Was on Singulair as a child.   Today's skin testing showed: Positive to grass, tress, mold, dust mites, cat, dog.  Start environmental control measures as below.  Start Ryaltris (olopatadine + mometasone nasal spray combination) 1-2 sprays per nostril twice a day. Sample given.  This replaces your other nasal sprays.  If this works well for you, then have [facility] ship the medication to your home - prescription already sent in.   Start Singulair (montelukast) 10mg  daily at night.  Cautioned that in some children/adults can experience behavioral changes including hyperactivity, agitation, depression, sleep disturbances and suicidal ideations. These side effects are rare, but if you notice them you should notify me and discontinue Singulair (montelukast).  May take carbinoxamine 4mg  to 8mg  2-3 times a day as needed.   Nasal saline spray (i.e., Simply Saline) or nasal saline lavage (i.e., NeilMed) is recommended as needed and prior to medicated nasal sprays.  Consider allergy injections for long term control if above medications do not help the symptoms - handout given.   Let know when ready to start.

## 2022-05-18 NOTE — Assessment & Plan Note (Signed)
Noted rash on wrist where wearing bracelet. Marland Kitchen Possible contact dermatitis from jewelry. . Limit jewelry contact.  . Recommend metal patch testing.

## 2022-05-18 NOTE — Patient Instructions (Addendum)
Today's skin testing showed: Positive to grass, tress, mold, dust mites, cat, dog.  Results given.  Environmental allergies Start environmental control measures as below. Start Ryaltris (olopatadine + mometasone nasal spray combination) 1-2 sprays per nostril twice a day. Sample given. This replaces your other nasal sprays. If this works well for you, then have [facility] ship the medication to your home - prescription already sent in.  Start Singulair (montelukast) 10mg  daily at night. Cautioned that in some children/adults can experience behavioral changes including hyperactivity, agitation, depression, sleep disturbances and suicidal ideations. These side effects are rare, but if you notice them you should notify me and discontinue Singulair (montelukast). May take carbinoxamine 4mg  to 8mg  2-3 times a day as needed.  Nasal saline spray (i.e., Simply Saline) or nasal saline lavage (i.e., NeilMed) is recommended as needed and prior to medicated nasal sprays. Consider allergy injections for long term control if above medications do not help the symptoms - handout given.  Let us know when ready to start.   Asthma: Daily controller medication(s): continue Symbicort 45mcg 1-2 puffs ONCE a day with spacer and rinse mouth afterwards. During upper respiratory infections/flares:  Start Symbicort 35mcg 2 puffs TWICE a day for 1-2 weeks until your breathing symptoms return to baseline.  May use albuterol rescue inhaler 2 puffs or nebulizer every 4 to 6 hours as needed for shortness of breath, chest tightness, coughing, and wheezing. May use albuterol rescue inhaler 2 puffs 5 to 15 minutes prior to strenuous physical activities. Monitor frequency of use.  Asthma control goals:  Full participation in all desired activities (may need albuterol before activity) Albuterol use two times or less a week on average (not counting use with activity) Cough interfering with sleep two times or less a month Oral  steroids no more than once a year No hospitalizations   Hives: Keep track of symptoms and take pictures. During a hive flare up:  Start zyrtec (cetirizine) 10mg  twice a day. If symptoms are not controlled or causes drowsiness let us know. Start pepcid (famotidine) 20mg  twice a day.  Avoid the following potential triggers: alcohol, tight clothing, NSAIDs, hot showers and getting overheated.  Skin Possible contact dermatitis from jewelry. Patches are best placed on Monday with return to office on Wednesday and Friday of same week for readings.  Patches once placed should not get wet.  You do not have to stop any medications for patch testing but should not be on oral prednisone. You can schedule a patch testing visit when convenient for your schedule.  this is done in our [place] office.   Follow up in 4 months or sooner if needed.   Our [place] office is moving in September 2023 to a new location. New address: [address] [place], [place], [place] [address] (white building). [place] office: [phone] (same phone number).   Reducing Pollen Exposure Pollen seasons: trees (spring), grass (summer) and ragweed/weeds (fall). Keep windows closed in your home and car to lower pollen exposure.  Install air conditioning in the bedroom and throughout the house if possible.  Avoid going out in dry windy days - especially early morning. Pollen counts are highest between 5 - 10 AM and on dry, hot and windy days.  Save outside activities for late afternoon or after a heavy rain, when pollen levels are lower.  Avoid mowing of grass if you have grass pollen allergy. Be aware that pollen can also be transported indoors on people and pets.  Dry your clothes in an automatic  dryer rather than hanging them outside where they might collect pollen.  Rinse hair and eyes before bedtime. Mold Control Mold and fungi can grow on a variety of surfaces provided certain temperature and moisture conditions exist.   Outdoor molds grow on plants, decaying vegetation and soil. The major outdoor mold, Alternaria and Cladosporium, are found in very high numbers during hot and dry conditions. Generally, a late summer - fall peak is seen for common outdoor fungal spores. Rain will temporarily lower outdoor mold spore count, but counts rise rapidly when the rainy period ends. The most important indoor molds are Aspergillus and Penicillium. Dark, humid and poorly ventilated basements are ideal sites for mold growth. The next most common sites of mold growth are the bathroom and the kitchen. Outdoor (Seasonal) Mold Control Use air conditioning and keep windows closed. Avoid exposure to decaying vegetation. Avoid leaf raking. Avoid grain handling. Consider wearing a face mask if working in moldy areas.  Indoor (Perennial) Mold Control  Maintain humidity below 50%. Get rid of mold growth on hard surfaces with water, detergent and, if necessary, 5% bleach (do not mix with other cleaners). Then dry the area completely. If mold covers an area more than 10 square feet, consider hiring an indoor environmental professional. For clothing, washing with soap and water is best. If moldy items cannot be cleaned and dried, throw them away. Remove sources e.g. contaminated carpets. Repair and seal leaking roofs or pipes. Using dehumidifiers in damp basements may be helpful, but empty the water and clean units regularly to prevent mildew from forming. All rooms, especially basements, bathrooms and kitchens, require ventilation and cleaning to deter mold and mildew growth. Avoid carpeting on concrete or damp floors, and storing items in damp areas. Control of House Dust Mite Allergen Dust mite allergens are a common trigger of allergy and asthma symptoms. While they can be found throughout the house, these microscopic creatures thrive in warm, humid environments such as bedding, upholstered furniture and carpeting. Because so much time  is spent in the bedroom, it is essential to reduce mite levels there.  Encase pillows, mattresses, and box springs in special allergen-proof fabric covers or airtight, zippered plastic covers.  Bedding should be washed weekly in hot water (130 F) and dried in a hot dryer. Allergen-proof covers are available for comforters and pillows that can't be regularly washed.  Wash the allergy-proof covers every few months. Minimize clutter in the bedroom. Keep pets out of the bedroom.  Keep humidity less than 50% by using a dehumidifier or air conditioning. You can buy a humidity measuring device called a hygrometer to monitor this.  If possible, replace carpets with hardwood, linoleum, or washable area rugs. If that's not possible, vacuum frequently with a vacuum that has a HEPA filter. Remove all upholstered furniture and non-washable window drapes from the bedroom. Remove all non-washable stuffed toys from the bedroom.  Wash stuffed toys weekly. Pet Allergen Avoidance: Contrary to popular opinion, there are no "hypoallergenic" breeds of dogs or cats. That is because people are not allergic to an animal's hair, but to an allergen found in the animal's saliva, dander (dead skin flakes) or urine. Pet allergy symptoms typically occur within minutes. For some people, symptoms can build up and become most severe 8 to 12 hours after contact with the animal. People with severe allergies can experience reactions in public places if dander has been transported on the pet owners' clothing. Keeping an animal outdoors is only a partial solution, since  homes with pets in the yard still have higher concentrations of animal allergens. Before getting a pet, ask your allergist to determine if you are allergic to animals. If your pet is already considered part of your family, try to minimize contact and keep the pet out of the bedroom and other rooms where you spend a great deal of time. As with dust mites, vacuum carpets often  or replace carpet with a hardwood floor, tile or linoleum. High-efficiency particulate air (HEPA) cleaners can reduce allergen levels over time. While dander and saliva are the source of cat and dog allergens, urine is the source of allergens from rabbits, hamsters, mice and [place] pigs; so ask a non-allergic family member to clean the animal's cage. If you have a pet allergy, talk to your allergist about the potential for allergy immunotherapy (allergy shots). This strategy can often provide long-term relief.

## 2022-06-21 ENCOUNTER — Telehealth: Payer: Self-pay | Admitting: Allergy

## 2022-06-21 MED ORDER — MONTELUKAST SODIUM 10 MG PO TABS: 10.0000 mg | ORAL_TABLET | Freq: Every day | ORAL | 0 refills | Status: DC

## 2022-06-21 NOTE — Telephone Encounter (Signed)
Called patient - DOB/Pharmacy verified - advised new prescription [facility] for Montelukast (Singulair) for 90 dys RF 0.  Patient verbalized understanding, no further questions.

## 2022-06-21 NOTE — Telephone Encounter (Signed)
Patient states that prescription for montelukast needs to be for 90 days instead of 30 days for the insurance to cover it. Patient states she would like this 90 day refill to [facility] on [address]. Patients call back number is [phone]

## 2022-09-22 ENCOUNTER — Other Ambulatory Visit: Payer: Self-pay | Admitting: Allergy

## 2022-10-14 ENCOUNTER — Encounter (HOSPITAL_COMMUNITY): Payer: Self-pay

## 2022-10-14 ENCOUNTER — Emergency Department (HOSPITAL_COMMUNITY)
Admission: EM | Admit: 2022-10-14 | Discharge: 2022-10-14 | Disposition: A | Discharge status: Home / Self Care | Payer: 59 | Attending: Emergency Medicine | Admitting: Emergency Medicine

## 2022-10-14 DIAGNOSIS — Z202 Contact with and (suspected) exposure to infections with a predominantly sexual mode of transmission: Secondary | ICD-10-CM | POA: Insufficient documentation

## 2022-10-14 DIAGNOSIS — R21 Rash and other nonspecific skin eruption: Secondary | ICD-10-CM | POA: Insufficient documentation

## 2022-10-14 DIAGNOSIS — J069 Acute upper respiratory infection, unspecified: Secondary | ICD-10-CM | POA: Insufficient documentation

## 2022-10-14 DIAGNOSIS — R82998 Other abnormal findings in urine: Secondary | ICD-10-CM | POA: Diagnosis not present

## 2022-10-14 DIAGNOSIS — Z1152 Encounter for screening for COVID-19: Secondary | ICD-10-CM | POA: Diagnosis not present

## 2022-10-14 DIAGNOSIS — R8290 Unspecified abnormal findings in urine: Secondary | ICD-10-CM

## 2022-10-14 LAB — URINALYSIS, ROUTINE W REFLEX MICROSCOPIC
Bacteria, UA: NONE SEEN
Bilirubin Urine: NEGATIVE
Glucose, UA: NEGATIVE mg/dL
Hgb urine dipstick: NEGATIVE
Ketones, ur: NEGATIVE mg/dL
Nitrite: NEGATIVE
Protein, ur: NEGATIVE mg/dL
Specific Gravity, Urine: 1.009 (ref 1.005–1.030)
pH: 7 (ref 5.0–8.0)

## 2022-10-14 LAB — RESP PANEL BY RT-PCR (RSV, FLU A&B, COVID)  RVPGX2
Influenza A by PCR: NEGATIVE
Influenza B by PCR: NEGATIVE
Resp Syncytial Virus by PCR: NEGATIVE
SARS Coronavirus 2 by RT PCR: NEGATIVE

## 2022-10-14 NOTE — Discharge Instructions (Addendum)
Cloudy appearance of urine should clear with drinking more water.   Follow up in your MyChart account for your test results. Take Zyrtec daily, this is available over the counter. Take Singulair as prescribed nightly.  Follow up with your allergist if you continue to experience rash. Follow up with your PCP for treatment if STD panel is positive- this will result in the next 3-5 days.  If your COVID test is positive- home quarantine for the first 5 days of illness (starting when your symptoms first started). You may then continue to mask for the next 5 days.

## 2022-10-14 NOTE — ED Provider Triage Note (Cosign Needed)
Emergency Medicine Provider Triage Evaluation Note  Sandra Oneill , a 36 y.o. female  was evaluated in triage.  Pt complains of hives on left cheek, concern for cloudy urine, without dysuria, she reports some white vaginal discharge, she does endorse some unprotected sex. Testing for STIs, she reports that she has had urticaria like this in the past, with no clear diagnosis, she reports allergy to raw tomatoes, sulfa, amoxicillin, denies any known allergic contacts.  Review of Systems  Positive: Urticaria, cloudy urine, congestion Negative: Difficulty breathing, fever, chills, chest pain, nausea, vomiting  Physical Exam  BP (!) 157/98 (BP Location: Left Arm)   Pulse 79   Temp 98.3 F (36.8 C) (Oral)   Resp 18   LMP 09/27/2022 (Approximate)   SpO2 97%  Gen:   Awake, no distress   Resp:  Normal effort  MSK:   Moves extremities without difficulty  Other:  Hives on left face, no ttp of abdomen. Patient generally scratching throughout  Medical Decision Making  Medically screening exam initiated at 10:39 AM.  Appropriate orders placed.  DEL WISEMAN was informed that the remainder of the evaluation will be completed by another provider, this initial triage assessment does not replace that evaluation, and the importance of remaining in the ED until their evaluation is complete.  Workup initiated   Sandra Oneill, [place] 10/14/22 1041

## 2022-10-14 NOTE — ED Provider Notes (Cosign Needed)
[facility]-EMERGENCY DEPT Provider Note   CSN: [id number] Arrival date & time: 10/14/22  1030     History  Chief Complaint  Patient presents with   Exposure to STD   Urticaria    Sandra Oneill is a 36 y.o. female.  36 year old female with complaint of cloudy urine without dysuria or urgency. Drinks 2 bottles water and sweet tea daily, onset last night and again this morning. Also broke out in hives today. Saw allergist for same earlier this year, told blood work was abnormal and monitored, thought possible parasite infection but this did not pan out. No known exposure to allergens. Also mild sinus drainage. Denies fevers, chills, body aches.  Did not take anything for symptoms. Has a raised area near left eye currently. Denies wheezing, SHOB, throat/tongues swelling, vomiting, diarrhea.  Also wanted testing for STD for cloudy urine, no new partners.        Home Medications Prior to Admission medications   Medication Sig Start Date End Date Taking? Authorizing Provider  albuterol (PROAIR HFA) 108 (90 Base) MCG/ACT inhaler INHALE TWO PUFFS BY MOUTH EVERY 6 HOURS AS NEEDED FOR SHORTNESS OF BREATH 11/08/17   Kathlynn Grate, DO  budesonide-formoterol (SYMBICORT) 80-4.5 MCG/ACT inhaler Inhale 2 puffs into the lungs in the morning and at bedtime. with spacer and rinse mouth afterwards. 05/18/22   Ellamae Sia, DO  Carbinoxamine Maleate 4 MG TABS Take 1-2 tablets 2-3 times a day as needed for allergies. Patient not taking: Reported on 05/18/2022 02/14/22   Ellamae Sia, DO  cetirizine (ZYRTEC) 10 MG tablet Take 10 mg by mouth daily.    [provider]  diphenhydrAMINE (BENADRYL) 25 MG tablet Take 1 tablet (25 mg total) by mouth every 6 (six) hours as needed. 10/18/21   Venter, Margaux, PA-C  montelukast (SINGULAIR) 10 MG tablet Take 1 tablet (10 mg total) by mouth at bedtime. 06/21/22   Ellamae Sia, DO  Olopatadine-Mometasone (RYALTRIS) [phone] MCG/ACT SUSP Place 1-2  sprays into the nose in the morning and at bedtime. 05/18/22   Ellamae Sia, DO      Allergies    Amoxicillin, Sulfamethoxazole-trimethoprim, and Tomato    Review of Systems   Review of Systems Negative except as per  HPI Physical Exam Updated Vital Signs BP (!) 138/98 (BP Location: Left Arm)   Pulse 78   Temp 98.4 F (36.9 C) (Oral)   Resp 16   LMP 09/27/2022 (Approximate)   SpO2 98%  Physical Exam Vitals and nursing note reviewed.  Constitutional:      General: She is not in acute distress.    Appearance: She is well-developed. She is not diaphoretic.  HENT:     Head: Normocephalic and atraumatic.     Right Ear: Tympanic membrane and ear canal normal.     Left Ear: Tympanic membrane and ear canal normal.     Nose: Congestion present.     Mouth/Throat:     Mouth: Mucous membranes are moist.     Pharynx: No oropharyngeal exudate.  Eyes:     Conjunctiva/sclera: Conjunctivae normal.  Cardiovascular:     Rate and Rhythm: Normal rate and regular rhythm.     Heart sounds: Normal heart sounds.  Pulmonary:     Effort: Pulmonary effort is normal.     Breath sounds: Normal breath sounds.  Musculoskeletal:     Cervical back: Neck supple.  Skin:    General: Skin is warm and dry.  Neurological:     Mental Status: She is alert and oriented to person, place, and time.  Psychiatric:        Behavior: Behavior normal.     ED Results / Procedures / Treatments   Labs (all labs ordered are listed, but only abnormal results are displayed) Labs Reviewed  URINALYSIS, ROUTINE W REFLEX MICROSCOPIC - Abnormal; Notable for the following components:      Result Value   APPearance HAZY (*)    Leukocytes,Ua TRACE (*)    All other components within normal limits  URINE CULTURE  RESP PANEL BY RT-PCR (RSV, FLU A&B, COVID)  RVPGX2  GC/CHLAMYDIA PROBE AMP ([place]) NOT AT [facility]    EKG None  Radiology No results found.  Procedures Procedures    Medications Ordered in  ED Medications - No data to display  ED Course/ Medical Decision Making/ A&P                            Medical Decision Making  36 year old female with concern for cloudy urine, mild URI symptoms, rash as above. Requests STD test for cloudy urine, otherwise not symptomatic, no new partners. Mild congestion on exam, has less than 44mm area raised lesion noted to left temple area, no other rash at this time.  Recommend zyrtec and Singulair as previously recommended by allergist, does not need refill of Singulair, Zyrtec OTC.  Follow up in mychart for results- if STD +, see PCP for tx. If viral swab positive, quarantine as recommended per specified virus.   UA with trace leukocytes without symptoms, per current recommendations, does not need treatment.    Urine culture obtained in triage, unable to cancel orders, there are no urinary symptoms (negative for abdominal pain, dysuria, frequency, urgency)- does not require treatment.          Final Clinical Impression(s) / ED Diagnoses Final diagnoses:  Rash  Viral URI with cough  Urine abnormality    Rx / DC Orders ED Discharge Orders     None         Jeannie Fend, PA-C 10/14/22 1332

## 2022-10-14 NOTE — ED Triage Notes (Signed)
Pt presents with c/o urticaria to the face that started this morning and exposure to STI. Pt reports she has been having some cloudy urine and some white vaginal discharge for several days.

## 2022-10-16 LAB — URINE CULTURE: Culture: 50000 — AB

## 2022-10-17 ENCOUNTER — Telehealth (HOSPITAL_BASED_OUTPATIENT_CLINIC_OR_DEPARTMENT_OTHER): Payer: Self-pay | Admitting: Emergency Medicine

## 2022-10-17 LAB — GC/CHLAMYDIA PROBE AMP (~~LOC~~) NOT AT ARMC
Chlamydia: NEGATIVE
Comment: NEGATIVE
Comment: NORMAL
Neisseria Gonorrhea: NEGATIVE

## 2022-10-17 NOTE — Telephone Encounter (Signed)
Post ED Visit - Positive Culture Follow-up  Culture report reviewed by antimicrobial stewardship pharmacist: [place] Team []  Elenor Quinones, Pharm.D. []  Heide Guile, Pharm.D., BCPS AQ-ID []  Parks Neptune, Pharm.D., BCPS []  Alycia Rossetti, Pharm.D., BCPS []  [place], Pharm.D., BCPS, AAHIVP []  Legrand Como, Pharm.D., BCPS, AAHIVP []  Salome Arnt, PharmD, BCPS []  Johnnette Gourd, PharmD, BCPS []  Hughes Better, PharmD, BCPS []  Leeroy Cha, PharmD []  Laqueta Linden, PharmD, BCPS []  Albertina Parr, PharmD  [place] Team [x]  Leodis Sias, PharmD []  Lindell Spar, PharmD []  Royetta Asal, PharmD []  Graylin Shiver, Rph []  Rema Fendt) Glennon Mac, PharmD []  Arlyn Dunning, PharmD []  Netta Cedars, PharmD []  Dia Sitter, PharmD []  Leone Haven, PharmD []  Gretta Arab, PharmD []  Theodis Shove, PharmD []  Peggyann Juba, PharmD []  Reuel Boom, PharmD   Positive urine culture Treated with none, low colony count, no further patient follow-up is required at this time.  Hazle Nordmann 10/17/2022, 10:32 AM

## 2022-10-20 ENCOUNTER — Telehealth: Payer: Self-pay | Admitting: Allergy

## 2022-10-20 NOTE — Telephone Encounter (Signed)
Per provider - called patient - DOB verified - advised of provider notation below.  Patient verbalized understanding, no further questions.

## 2022-10-20 NOTE — Telephone Encounter (Signed)
Patient states she is having hives every other day. It started on her face but now she has them on her thighs. She takes Allegra and Singulair which helps the hives clear up that day and the following day but they come right back. She went to the ER but they told her just to take her allergy medicine. Patient wants to know what she needs to do regarding this issue.

## 2022-10-20 NOTE — Telephone Encounter (Signed)
Please call patient back.  She needs to take the medications everyday.   Continue Singulair (montelukast) 10mg  daily at night. Take allegra 155mcg once a day and may take twice a day if needed.   Avoid the following potential triggers: alcohol, tight clothing, NSAIDs, hot showers and getting overheated.  If not controlled, have her schedule appointment.

## 2024-03-31 ENCOUNTER — Ambulatory Visit: Admitting: Allergy

## 2024-04-02 NOTE — Progress Notes (Signed)
 Right  [address] [place] [address] Dept: [phone]  FOLLOW UP NOTE  Patient ID: Sandra Oneill, female    DOB: 08-13-1986  Age: 38 y.o. MRN: [id number] Date of Office Visit: 04/03/2024  Assessment  Chief Complaint: Follow-up (Pt reports last ov 2 years ago. Pt reports that she remembers Dr. Luke stated that the shots would be the next step for her. Last seen 05/2022.) and Asthma (ACT: 22)  HPI Sandra Oneill is a 38 year old female who presents to the clinic for follow-up visit.  She was last seen in this clinic on 05/18/2022 by Dr. Luke for evaluation of asthma, allergic rhinitis, urticaria, and possible allergic contact dermatitis.  In the interim, she reports influenza a in January, URI 12/28/2023 requiring doxycycline and prednisone , and most recently on 03/28/2024, sinus infection requiring doxycycline and prednisone  as well as nystatin  for thrush.  At today's visit, she reports her asthma has been moderately well-controlled with occasional chest tightness, intermittent wheezing in the morning, and occasional dry cough.  She continues Breo 100-1 puff once a day and uses albuterol  about 2 days a week with relief of symptoms.   Allergic rhinitis is reported as poorly controlled with symptoms including clear rhinorrhea, nasal congestion, sneezing, and postnasal drainage.  She continues azelastine 2 sprays in each nostril twice a day and has recently stopped taking cetirizine .  Her last environmental allergy  skin testing on 05/18/2022 was positive to grass pollen, tree pollen, mold, dust mite, cat, and dog.  There is 1 dog in the home and she does not currently have dust mite free covers on her mattress or pillow.  She is interested in moving forward with allergy  injections at this time.  Written information provided. Allergic conjunctivitis is reported as moderately well-controlled with symptoms including red, watery, and itchy eyes for which she continues an over-the-counter allergy  eyedrop  with relief of symptoms.  She reports hives have been well-controlled with no urticarial breakouts for about 1 year.  She continues to follow-up with her dermatology specialist for control of atopic dermatitis.  Her current medications are listed in the chart.  Drug Allergies:  Allergies  Allergen Reactions   Amoxicillin     REACTION: Nose bleeds and dizziness   Sulfamethoxazole-Trimethoprim     REACTION: itching or arms, legs, and lips as well as tingling of lips   Tomato Hives and Rash    RAW TOMATO    Physical Exam: BP 112/80 (BP Location: Left Arm, Patient Position: Sitting, Cuff Size: Normal)   Pulse 62   Temp 98.2 F (36.8 C) (Temporal)   Resp 18   Ht 5' 3.39 (1.61 m)   Wt 155 lb 14.4 oz (70.7 kg)   SpO2 98%   BMI 27.28 kg/m    Physical Exam Vitals reviewed.  Constitutional:      Appearance: Normal appearance.  HENT:     Head: Normocephalic and atraumatic.     Right Ear: Tympanic membrane normal.     Left Ear: Tympanic membrane normal.     Nose:     Comments: Bilateral nares slightly erythematous with thin clear nasal drainage noted.  Pharynx normal.  Ears normal.  Eyes normal    Mouth/Throat:     Pharynx: Oropharynx is clear.   Eyes:     Conjunctiva/sclera: Conjunctivae normal.    Cardiovascular:     Rate and Rhythm: Normal rate and regular rhythm.     Heart sounds: Normal heart sounds. No murmur heard. Pulmonary:  Effort: Pulmonary effort is normal.     Breath sounds: Normal breath sounds.     Comments: Lungs clear to auscultation  Musculoskeletal:        General: Normal range of motion.     Cervical back: Normal range of motion and neck supple.   Skin:    General: Skin is warm and dry.   Neurological:     Mental Status: She is alert and oriented to person, place, and time.   Psychiatric:        Mood and Affect: Mood normal.        Behavior: Behavior normal.        Thought Content: Thought content normal.        Judgment: Judgment  normal.     Diagnostics: FVC 3.07 which is 100% of predicted value, FEV1 2.20 which is 86% of predicted value.  Spirometry indicates normal ventilatory function.  Assessment and Plan: 1. Moderate persistent asthma without complication   2. Seasonal and perennial allergic rhinitis   3. Urticaria   4. Allergic contact dermatitis due to other agents     Meds ordered this encounter  Medications   fluticasone  furoate-vilanterol (BREO ELLIPTA ) 200-25 MCG/ACT AEPB    Sig: Inhale 1 puff into the lungs daily.    Dispense:  1 each    Refill:  1    Patient Instructions  Asthma Begin Breo 200-1 puff once a day for the next 1 month, then continue Breo 100-1 puff once a day to prevent cough or wheeze Continue albuterol  2 puffs once every 4 hours if needed for cough or wheeze You may use albuterol  2 puffs 5 to 15 minutes before activity to decrease cough or wheeze  Allergic rhinitis Continue allergen avoidance measures directed toward grass pollen, tree pollen, mold, dust mite, cat, and dog as listed below Continue montelukast  10 mg once a day to control symptoms of allergic rhinitis Begin levocetirizine 5 mg once a day if needed for runny nose or itch.  This will replace cetirizine  Begin Flonase  2 sprays in each nostril once a day if needed for 1 stuffy nose.  In the right nostril, point the applicator out toward the right ear. In the left nostril, point the applicator out toward the left ear Continue azelastine 2 sprays in each nostril up to twice a day if needed for runny nose or itch Consider saline nasal rinses as needed for nasal symptoms. Use this before any medicated nasal sprays for best result Consider allergen immunotherapy if your symptoms are not well-controlled with the treatment plan as listed above  Hives (urticaria) Take the least amount of medications while remaining hive free Levocetirizine (Xyzal) 5 mg twice a day and famotidine  (Pepcid ) 20 mg twice a day. If no symptoms  for 7-14 days then decrease to. Levocetirizine (Xyzal) 5 mg twice a day and famotidine  (Pepcid ) 20 mg once a day.  If no symptoms for 7-14 days then decrease to. Levocetirizine (Xyzal) 5 mg twice a day.  If no symptoms for 7-14 days then decrease to. Levocetirizine (Xyzal) 5 mg once a day.   May use Benadryl  (diphenhydramine ) as needed for breakthrough hives       If symptoms return, then step up dosage  Keep a detailed symptom journal including foods eaten, contact with allergens, medications taken, weather changes.    Atopic dermatis Continue to follow-up with your dermatology specialist as recommended  Call the clinic if this treatment plan is not working well for you.  Follow up in 3 months or sooner if needed.   Return in about 3 months (around 07/04/2024), or if symptoms worsen or fail to improve.    Thank you for the opportunity to care for this patient.  Please do not hesitate to contact me with questions.  Arlean Mutter, FNP [facility] 

## 2024-04-02 NOTE — Patient Instructions (Addendum)
 Asthma Continue Symbicort  80-2 puffs twice a day with a spacer to prevent cough or wheeze Continue albuterol  2 puffs once every 4 hours if needed for cough or wheeze You may use albuterol  2 puffs 5 to 15 minutes before activity to decrease cough or wheeze  Allergic rhinitis Continue allergen avoidance measures directed toward grass pollen, tree pollen, mold, dust mite, cat, and dog as listed below Continue montelukast  10 mg once a day to control symptoms of allergic rhinitis Continue Ryaltris  2 sprays in each nostril up to twice a day if needed for nasal symptoms Carbinoxamine  or [id number] Consider saline nasal rinses as needed for nasal symptoms. Use this before any medicated nasal sprays for best result Consider allergen immunotherapy if your symptoms are not well-controlled with the treatment plan as listed above  Hives (urticaria) Take the least amount of medications while remaining hive free Cetirizine  (Zyrtec ) 10mg  twice a day and famotidine  (Pepcid ) 20 mg twice a day. If no symptoms for 7-14 days then decrease to. Cetirizine  (Zyrtec ) 10mg  twice a day and famotidine  (Pepcid ) 20 mg once a day.  If no symptoms for 7-14 days then decrease to. Cetirizine  (Zyrtec ) 10mg  twice a day.  If no symptoms for 7-14 days then decrease to. Cetirizine  (Zyrtec ) 10mg  once a day.  May use Benadryl  (diphenhydramine ) as needed for breakthrough hives       If symptoms return, then step up dosage  Keep a detailed symptom journal including foods eaten, contact with allergens, medications taken, weather changes.    Allergic contact dermatitis Consider patch testing.  Patches are placed on Monday, removed on Wednesday, first reading on Wednesday, and final reading on Friday.  Call the clinic to set up an appointment if you are interested in this option.  Call the clinic if this treatment plan is not working well for you.  Follow up in *** or sooner if needed.  Reducing Pollen  Exposure The American Academy of Allergy , Asthma and Immunology suggests the following steps to reduce your exposure to pollen during allergy  seasons. Do not hang sheets or clothing out to dry; pollen may collect on these items. Do not mow lawns or spend time around freshly cut grass; mowing stirs up pollen. Keep windows closed at night.  Keep car windows closed while driving. Minimize morning activities outdoors, a time when pollen counts are usually at their highest. Stay indoors as much as possible when pollen counts or humidity is high and on windy days when pollen tends to remain in the air longer. Use air conditioning when possible.  Many air conditioners have filters that trap the pollen spores. Use a HEPA room air filter to remove pollen form the indoor air you breathe.  Control of Mold Allergen Mold and fungi can grow on a variety of surfaces provided certain temperature and moisture conditions exist.  Outdoor molds grow on plants, decaying vegetation and soil.  The major outdoor mold, Alternaria and Cladosporium, are found in very high numbers during hot and dry conditions.  Generally, a late Summer - Fall peak is seen for common outdoor fungal spores.  Rain will temporarily lower outdoor mold spore count, but counts rise rapidly when the rainy period ends.  The most important indoor molds are Aspergillus and Penicillium.  Dark, humid and poorly ventilated basements are ideal sites for mold growth.  The next most common sites of mold growth are the bathroom and the kitchen.  Outdoor [employer] Use air conditioning and keep windows closed Avoid exposure to decaying  vegetation. Avoid leaf raking. Avoid grain handling. Consider wearing a face mask if working in moldy areas.  Indoor Mold Control Maintain humidity below 50%. Clean washable surfaces with 5% bleach solution. Remove sources e.g. Contaminated carpets.   Control of Dust Mite Allergen Dust mites play a major role in  allergic asthma and rhinitis. They occur in environments with high humidity wherever human skin is found. Dust mites absorb humidity from the atmosphere (ie, they do not drink) and feed on organic matter (including shed human and animal skin). Dust mites are a microscopic type of insect that you cannot see with the naked eye. High levels of dust mites have been detected from mattresses, pillows, carpets, upholstered furniture, bed covers, clothes, soft toys and any woven material. The principal allergen of the dust mite is found in its feces. A gram of dust may contain 1,000 mites and 250,000 fecal particles. Mite antigen is easily measured in the air during house cleaning activities. Dust mites do not bite and do not cause harm to humans, other than by triggering allergies/asthma.  Ways to decrease your exposure to dust mites in your home:  1. Encase mattresses, box springs and pillows with a mite-impermeable barrier or cover  2. Wash sheets, blankets and drapes weekly in hot water (130 F) with detergent and dry them in a dryer on the hot setting.  3. Have the room cleaned frequently with a vacuum cleaner and a damp dust-mop. For carpeting or rugs, vacuuming with a vacuum cleaner equipped with a high-efficiency particulate air (HEPA) filter. The dust mite allergic individual should not be in a room which is being cleaned and should wait 1 hour after cleaning before going into the room.  4. Do not sleep on upholstered furniture (eg, couches).  5. If possible removing carpeting, upholstered furniture and drapery from the home is ideal. Horizontal blinds should be eliminated in the rooms where the person spends the most time (bedroom, study, television room). Washable vinyl, roller-type shades are optimal.  6. Remove all non-washable stuffed toys from the bedroom. Wash stuffed toys weekly like sheets and blankets above.  7. Reduce indoor humidity to less than 50%. Inexpensive humidity monitors can be  purchased at most hardware stores. Do not use a humidifier as can make the problem worse and are not recommended.  Control of Dog or Cat Allergen Avoidance is the best way to manage a dog or cat allergy . If you have a dog or cat and are allergic to dog or cats, consider removing the dog or cat from the home. If you have a dog or cat but don't want to find it a new home, or if your family wants a pet even though someone in the household is allergic, here are some strategies that may help keep symptoms at bay:  Keep the pet out of your bedroom and restrict it to only a few rooms. Be advised that keeping the dog or cat in only one room will not limit the allergens to that room. Don't pet, hug or kiss the dog or cat; if you do, wash your hands with soap and water. High-efficiency particulate air (HEPA) cleaners run continuously in a bedroom or living room can reduce allergen levels over time. Regular use of a high-efficiency vacuum cleaner or a central vacuum can reduce allergen levels. Giving your dog or cat a bath at least once a week can reduce airborne allergen.

## 2024-04-03 ENCOUNTER — Ambulatory Visit: Admitting: Family Medicine

## 2024-04-03 ENCOUNTER — Encounter: Payer: Self-pay | Admitting: Family Medicine

## 2024-04-03 ENCOUNTER — Other Ambulatory Visit: Payer: Self-pay

## 2024-04-03 VITALS — BP 112/80 | HR 62 | Temp 98.2°F | Resp 18 | Ht 63.39 in | Wt 155.9 lb

## 2024-04-03 DIAGNOSIS — J3089 Other allergic rhinitis: Secondary | ICD-10-CM | POA: Diagnosis not present

## 2024-04-03 DIAGNOSIS — L509 Urticaria, unspecified: Secondary | ICD-10-CM

## 2024-04-03 DIAGNOSIS — J4540 Moderate persistent asthma, uncomplicated: Secondary | ICD-10-CM | POA: Diagnosis not present

## 2024-04-03 DIAGNOSIS — L2389 Allergic contact dermatitis due to other agents: Secondary | ICD-10-CM

## 2024-04-03 DIAGNOSIS — J302 Other seasonal allergic rhinitis: Secondary | ICD-10-CM | POA: Diagnosis not present

## 2024-04-03 MED ORDER — FLUTICASONE FUROATE-VILANTEROL 200-25 MCG/ACT IN AEPB: 1.0000 | INHALATION_SPRAY | Freq: Every day | RESPIRATORY_TRACT | 1 refills | Status: DC

## 2024-04-14 ENCOUNTER — Other Ambulatory Visit: Payer: Self-pay | Admitting: Allergy

## 2024-04-14 DIAGNOSIS — J3089 Other allergic rhinitis: Secondary | ICD-10-CM

## 2024-04-14 DIAGNOSIS — J3081 Allergic rhinitis due to animal (cat) (dog) hair and dander: Secondary | ICD-10-CM | POA: Diagnosis not present

## 2024-04-14 MED ORDER — EPINEPHRINE 0.3 MG/0.3ML IJ SOAJ: 0.3000 mg | INTRAMUSCULAR | 1 refills | Status: DC | PRN

## 2024-04-14 NOTE — Progress Notes (Signed)
 Aeroallergen Immunotherapy  Ordering Provider: Dr. Orlan Cramp  Patient Details Name: Sandra Oneill MRN: [id number] Date of Birth: 08-11-1986  Order 2 of 2  Vial Label: M-Dm  0.2 ml (Volume)  1:20 Concentration -- Alternaria alternata 0.2 ml (Volume)  1:10 Concentration -- Aspergillus mix 0.2 ml (Volume)  1:10 Concentration -- Penicillium mix 0.2 ml (Volume)  1:20 Concentration -- Bipolaris sorokiniana 0.2 ml (Volume)  1:20 Concentration -- Drechslera spicifera 0.2 ml (Volume)  1:10 Concentration -- Mucor plumbeus 0.2 ml (Volume)  1:40 Concentration -- Aureobasidium pullulans 0.2 ml (Volume)  1:40 Concentration -- Phoma betae 0.5 ml (Volume)   AU Concentration -- Mite Mix (DF 5,000 & DP 5,000)   2.1  ml Extract Subtotal 2.9  ml Diluent 5.0  ml Maintenance Total  Schedule:  B Silver Vial (1:1,000,000): Schedule B (6 doses) Blue Vial (1:100,000): Schedule B (6 doses) Yellow Vial (1:10,000): Schedule B (6 doses) Green Vial (1:1,000): Schedule B (6 doses) Red Vial (1:100): Schedule A (14 doses)  Special Instructions: May come in 1-2 times a week during build up as tolerated. Once a week on red vial. Once she is on red vial #1 0.5cc go every 2 weeks, on red vial #2 0.5cc go every 4 weeks. May build up red vials faster (0.1, 0.3, 0.5).

## 2024-04-14 NOTE — Progress Notes (Signed)
 Aeroallergen Immunotherapy  Ordering Provider: Dr. Orlan Cramp  Patient Details Name: Sandra Oneill MRN: [id number] Date of Birth: [birth year]  Order 1 of 2  Vial Label: G-T-D-C  0.3 ml (Volume)  BAU Concentration -- 7 Grass Mix* 100,000 ([place]  Blue, [place], Orchard, Perennial Rye, RedTop, Sweet Vernal, Timothy) 0.3 ml (Volume)  BAU Concentration -- [place] 10,000 0.2 ml (Volume)  1:20 Concentration -- [place] 0.5 ml (Volume)  1:20 Concentration -- Eastern 10 Tree Mix (also Sweet Gum) 0.2 ml (Volume)  1:10 Concentration -- Pecan Pollen 0.2 ml (Volume)  1:20 Concentration -- Walnut, Black Pollen 0.5 ml (Volume)  1:10 Concentration -- Cat Hair 0.5 ml (Volume)  1:10 Concentration -- Dog Epithelia   2.7  ml Extract Subtotal 2.3 ml Diluent 5.0  ml Maintenance Total  Schedule:  B Silver Vial (1:1,000,000): Schedule B (6 doses) Blue Vial (1:100,000): Schedule B (6 doses) Yellow Vial (1:10,000): Schedule B (6 doses) Green Vial (1:1,000): Schedule B (6 doses) Red Vial (1:100): Schedule A (14 doses)  Special Instructions: May come in 1-2 times a week during build up as tolerated. Once a week on red vial. Once she is on red vial #1 0.5cc go every 2 weeks, on red vial #2 0.5cc go every 4 weeks. May build up red vials faster (0.1, 0.3, 0.5).

## 2024-04-14 NOTE — Progress Notes (Signed)
 VIALS MADE 04-14-24

## 2024-04-15 DIAGNOSIS — J3089 Other allergic rhinitis: Secondary | ICD-10-CM | POA: Diagnosis not present

## 2024-04-28 ENCOUNTER — Ambulatory Visit (INDEPENDENT_AMBULATORY_CARE_PROVIDER_SITE_OTHER)

## 2024-04-28 DIAGNOSIS — J309 Allergic rhinitis, unspecified: Secondary | ICD-10-CM | POA: Diagnosis not present

## 2024-04-28 NOTE — Progress Notes (Signed)
 Immunotherapy   Patient Details  Name: PATTIANN SOLANKI MRN: [id number] Date of Birth: September 22, 1986  04/28/2024  Bobetta LOISE Ernst started injections for G-T-D-C and M-DM. Patient received 0.05 ml of both silver vials with an exp of 04/14/2025. Patient waited 30 minutes with no problems.  Following schedule: B  Frequency:2 times per week Epi-Pen:Epi-Pen Available  Consent signed and patient instructions given.   Rosina LOISE Irving 04/28/2024, 4:04 PM

## 2024-04-29 MED ORDER — LEVOCETIRIZINE DIHYDROCHLORIDE 5 MG PO TABS: 5.0000 mg | ORAL_TABLET | Freq: Every evening | ORAL | 5 refills | Status: AC

## 2024-04-29 NOTE — Addendum Note (Signed)
 Addended by: Raekwon Winkowski N on: 04/29/2024 08:37 AM   Modules accepted: Orders

## 2024-05-01 ENCOUNTER — Ambulatory Visit

## 2024-05-01 DIAGNOSIS — J309 Allergic rhinitis, unspecified: Secondary | ICD-10-CM

## 2024-05-05 ENCOUNTER — Ambulatory Visit (INDEPENDENT_AMBULATORY_CARE_PROVIDER_SITE_OTHER)

## 2024-05-05 DIAGNOSIS — J309 Allergic rhinitis, unspecified: Secondary | ICD-10-CM

## 2024-05-08 ENCOUNTER — Ambulatory Visit (INDEPENDENT_AMBULATORY_CARE_PROVIDER_SITE_OTHER)

## 2024-05-08 ENCOUNTER — Telehealth: Payer: Self-pay | Admitting: Allergy

## 2024-05-08 DIAGNOSIS — J309 Allergic rhinitis, unspecified: Secondary | ICD-10-CM

## 2024-05-08 NOTE — Telephone Encounter (Signed)
 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  Sandra Oneill states she just needs it filled out to state that she gets injections and how often she gets them.  Sandra Oneill states she is having to leave work early to come and get her injections.  Sandra Oneill would like us  to fax this to  [phone] and then let her know when it was faxed and she will come and pick up the forms when ready.

## 2024-05-12 ENCOUNTER — Ambulatory Visit (INDEPENDENT_AMBULATORY_CARE_PROVIDER_SITE_OTHER)

## 2024-05-12 DIAGNOSIS — J309 Allergic rhinitis, unspecified: Secondary | ICD-10-CM | POA: Diagnosis not present

## 2024-05-12 NOTE — Telephone Encounter (Signed)
 Forms filled out.  Please fax to [phone].  Call patient that forms ready to be picked up.  Thank you.

## 2024-05-13 NOTE — Telephone Encounter (Signed)
 Forms were given to [place]. Patient came into office to pick up forms. Nat stated she was going to fax them to the patients work place.

## 2024-05-14 ENCOUNTER — Ambulatory Visit (INDEPENDENT_AMBULATORY_CARE_PROVIDER_SITE_OTHER)

## 2024-05-14 DIAGNOSIS — J309 Allergic rhinitis, unspecified: Secondary | ICD-10-CM | POA: Diagnosis not present

## 2024-05-19 ENCOUNTER — Ambulatory Visit (INDEPENDENT_AMBULATORY_CARE_PROVIDER_SITE_OTHER)

## 2024-05-19 DIAGNOSIS — J309 Allergic rhinitis, unspecified: Secondary | ICD-10-CM | POA: Diagnosis not present

## 2024-05-22 ENCOUNTER — Ambulatory Visit (INDEPENDENT_AMBULATORY_CARE_PROVIDER_SITE_OTHER)

## 2024-05-22 DIAGNOSIS — J309 Allergic rhinitis, unspecified: Secondary | ICD-10-CM | POA: Diagnosis not present

## 2024-05-27 ENCOUNTER — Ambulatory Visit

## 2024-05-27 ENCOUNTER — Ambulatory Visit (INDEPENDENT_AMBULATORY_CARE_PROVIDER_SITE_OTHER): Payer: Self-pay

## 2024-05-27 DIAGNOSIS — J309 Allergic rhinitis, unspecified: Secondary | ICD-10-CM

## 2024-05-28 NOTE — Telephone Encounter (Signed)
 Sandra Oneill came in with more paper work.  Apparently her job is requiring more information or additional information.  Netanya states that she doesn't need FMLA or LOA.  These forms need to be filled out for her to be able to leave for for an extended period of time to drive here and get her injection and wait here the amount of time she is supposed to wait.  Dr. Luke is the signing provider.  Placed in nurses bin.

## 2024-05-28 NOTE — Telephone Encounter (Signed)
 Forms are definitely FMLA paperwork from [facility].  Patient will need to pay the $25 fee for processing paperwork as well before faxing to [facility].  Forms have been placed @ back nursing station in the Pending forms folder.

## 2024-05-29 ENCOUNTER — Ambulatory Visit (INDEPENDENT_AMBULATORY_CARE_PROVIDER_SITE_OTHER)

## 2024-05-29 DIAGNOSIS — J309 Allergic rhinitis, unspecified: Secondary | ICD-10-CM

## 2024-06-02 ENCOUNTER — Other Ambulatory Visit: Payer: Self-pay | Admitting: Family Medicine

## 2024-06-02 NOTE — Telephone Encounter (Signed)
Form filled out. Please let patient know.

## 2024-06-03 ENCOUNTER — Other Ambulatory Visit: Payer: Self-pay | Admitting: Otolaryngology

## 2024-06-03 NOTE — Telephone Encounter (Signed)
 Called patient - DOB/NEED DPR verified - LMOVM advising to contact office regarding $25 fee for completion of FMLA paperwork.  Fee will need to be paid before faxing to [facility].

## 2024-06-05 ENCOUNTER — Other Ambulatory Visit: Payer: Self-pay | Admitting: Otolaryngology

## 2024-06-10 ENCOUNTER — Ambulatory Visit (INDEPENDENT_AMBULATORY_CARE_PROVIDER_SITE_OTHER)

## 2024-06-10 DIAGNOSIS — J309 Allergic rhinitis, unspecified: Secondary | ICD-10-CM

## 2024-06-10 NOTE — Telephone Encounter (Signed)
 FMLA paperwork has been faxed to [facility] @ [phone] ---- Leave #: [id number] Gl  Copy will be placed - [address] side for patient to p/u on Thursday when she receives her allergy  injections.  Patient needs to complete DPR before receiving FMLA paperwork.

## 2024-06-10 NOTE — Telephone Encounter (Signed)
 Sandra Oneill paid the $25 fee and would like for us  to fax her FMLA forms.

## 2024-06-17 ENCOUNTER — Ambulatory Visit (INDEPENDENT_AMBULATORY_CARE_PROVIDER_SITE_OTHER)

## 2024-06-17 DIAGNOSIS — J309 Allergic rhinitis, unspecified: Secondary | ICD-10-CM | POA: Diagnosis not present

## 2024-06-19 ENCOUNTER — Ambulatory Visit (INDEPENDENT_AMBULATORY_CARE_PROVIDER_SITE_OTHER)

## 2024-06-19 DIAGNOSIS — J309 Allergic rhinitis, unspecified: Secondary | ICD-10-CM

## 2024-06-24 ENCOUNTER — Ambulatory Visit (INDEPENDENT_AMBULATORY_CARE_PROVIDER_SITE_OTHER)

## 2024-06-24 DIAGNOSIS — J309 Allergic rhinitis, unspecified: Secondary | ICD-10-CM | POA: Diagnosis not present

## 2024-06-26 ENCOUNTER — Ambulatory Visit (INDEPENDENT_AMBULATORY_CARE_PROVIDER_SITE_OTHER)

## 2024-06-26 DIAGNOSIS — J309 Allergic rhinitis, unspecified: Secondary | ICD-10-CM

## 2024-07-01 ENCOUNTER — Ambulatory Visit (INDEPENDENT_AMBULATORY_CARE_PROVIDER_SITE_OTHER)

## 2024-07-01 DIAGNOSIS — J309 Allergic rhinitis, unspecified: Secondary | ICD-10-CM

## 2024-07-03 ENCOUNTER — Ambulatory Visit (INDEPENDENT_AMBULATORY_CARE_PROVIDER_SITE_OTHER)

## 2024-07-03 DIAGNOSIS — J309 Allergic rhinitis, unspecified: Secondary | ICD-10-CM | POA: Diagnosis not present

## 2024-07-07 ENCOUNTER — Ambulatory Visit: Admitting: Allergy

## 2024-07-08 ENCOUNTER — Ambulatory Visit (INDEPENDENT_AMBULATORY_CARE_PROVIDER_SITE_OTHER)

## 2024-07-08 ENCOUNTER — Ambulatory Visit: Admitting: Allergy

## 2024-07-08 DIAGNOSIS — J309 Allergic rhinitis, unspecified: Secondary | ICD-10-CM

## 2024-07-10 ENCOUNTER — Ambulatory Visit (INDEPENDENT_AMBULATORY_CARE_PROVIDER_SITE_OTHER)

## 2024-07-10 DIAGNOSIS — J309 Allergic rhinitis, unspecified: Secondary | ICD-10-CM | POA: Diagnosis not present

## 2024-07-14 ENCOUNTER — Encounter (HOSPITAL_BASED_OUTPATIENT_CLINIC_OR_DEPARTMENT_OTHER): Payer: Self-pay | Admitting: Otolaryngology

## 2024-07-14 ENCOUNTER — Other Ambulatory Visit: Payer: Self-pay

## 2024-07-15 ENCOUNTER — Ambulatory Visit (INDEPENDENT_AMBULATORY_CARE_PROVIDER_SITE_OTHER)

## 2024-07-15 DIAGNOSIS — J309 Allergic rhinitis, unspecified: Secondary | ICD-10-CM

## 2024-07-17 ENCOUNTER — Ambulatory Visit

## 2024-07-17 DIAGNOSIS — J309 Allergic rhinitis, unspecified: Secondary | ICD-10-CM | POA: Diagnosis not present

## 2024-07-21 ENCOUNTER — Encounter (HOSPITAL_BASED_OUTPATIENT_CLINIC_OR_DEPARTMENT_OTHER)
Admission: RE | Disposition: A | Discharge status: Home / Self Care | Payer: Self-pay | Source: Home / Self Care | Attending: Otolaryngology

## 2024-07-21 ENCOUNTER — Encounter (HOSPITAL_BASED_OUTPATIENT_CLINIC_OR_DEPARTMENT_OTHER): Payer: Self-pay | Admitting: Otolaryngology

## 2024-07-21 ENCOUNTER — Ambulatory Visit (HOSPITAL_BASED_OUTPATIENT_CLINIC_OR_DEPARTMENT_OTHER)
Admission: RE | Admit: 2024-07-21 | Discharge: 2024-07-21 | Disposition: A | Discharge status: Home / Self Care | Attending: Otolaryngology | Admitting: Otolaryngology

## 2024-07-21 ENCOUNTER — Ambulatory Visit (HOSPITAL_BASED_OUTPATIENT_CLINIC_OR_DEPARTMENT_OTHER): Admitting: Anesthesiology

## 2024-07-21 ENCOUNTER — Other Ambulatory Visit: Payer: Self-pay

## 2024-07-21 DIAGNOSIS — Z01818 Encounter for other preprocedural examination: Secondary | ICD-10-CM

## 2024-07-21 DIAGNOSIS — J3489 Other specified disorders of nose and nasal sinuses: Secondary | ICD-10-CM | POA: Diagnosis not present

## 2024-07-21 DIAGNOSIS — J342 Deviated nasal septum: Secondary | ICD-10-CM | POA: Diagnosis present

## 2024-07-21 DIAGNOSIS — J45909 Unspecified asthma, uncomplicated: Secondary | ICD-10-CM | POA: Diagnosis not present

## 2024-07-21 HISTORY — DX: Deviated nasal septum: J34.2

## 2024-07-21 HISTORY — DX: Prediabetes: R73.03

## 2024-07-21 HISTORY — PX: NASAL SEPTOPLASTY W/ TURBINOPLASTY: SHX2070

## 2024-07-21 LAB — POCT PREGNANCY, URINE: Preg Test, Ur: NEGATIVE

## 2024-07-21 SURGERY — SEPTOPLASTY, NOSE, WITH NASAL TURBINATE REDUCTION
Anesthesia: General | Site: Nose | Laterality: Bilateral

## 2024-07-21 MED ORDER — DROPERIDOL 2.5 MG/ML IJ SOLN: 0.6250 mg | Freq: Once | INTRAMUSCULAR | Status: DC | PRN

## 2024-07-21 MED ORDER — FENTANYL CITRATE (PF) 100 MCG/2ML IJ SOLN
INTRAMUSCULAR | Status: AC
  Filled 2024-07-21: qty 2

## 2024-07-21 MED ORDER — FENTANYL CITRATE (PF) 100 MCG/2ML IJ SOLN
25.0000 ug | INTRAMUSCULAR | Status: DC | PRN
  Administered 2024-07-21 (×3): 50 ug via INTRAVENOUS

## 2024-07-21 MED ORDER — DEXAMETHASONE SODIUM PHOSPHATE 4 MG/ML IJ SOLN
INTRAMUSCULAR | Status: DC | PRN
  Administered 2024-07-21: 10 mg via INTRAVENOUS

## 2024-07-21 MED ORDER — SCOPOLAMINE 1 MG/3DAYS TD PT72
MEDICATED_PATCH | TRANSDERMAL | Status: AC
  Filled 2024-07-21: qty 1

## 2024-07-21 MED ORDER — SCOPOLAMINE 1 MG/3DAYS TD PT72
1.0000 | MEDICATED_PATCH | TRANSDERMAL | Status: DC
  Administered 2024-07-21: 1 mg via TRANSDERMAL

## 2024-07-21 MED ORDER — LIDOCAINE HCL (CARDIAC) PF 100 MG/5ML IV SOSY
PREFILLED_SYRINGE | INTRAVENOUS | Status: DC | PRN
  Administered 2024-07-21: 50 mg via INTRAVENOUS

## 2024-07-21 MED ORDER — ONDANSETRON HCL 4 MG/2ML IJ SOLN
INTRAMUSCULAR | Status: DC | PRN
  Administered 2024-07-21: 4 mg via INTRAVENOUS

## 2024-07-21 MED ORDER — ONDANSETRON HCL 4 MG/2ML IJ SOLN
INTRAMUSCULAR | Status: AC
  Filled 2024-07-21: qty 2

## 2024-07-21 MED ORDER — OXYCODONE HCL 5 MG/5ML PO SOLN: 5.0000 mg | Freq: Once | ORAL | Status: AC | PRN

## 2024-07-21 MED ORDER — ACETAMINOPHEN 10 MG/ML IV SOLN: 1000.0000 mg | Freq: Once | INTRAVENOUS | Status: DC | PRN

## 2024-07-21 MED ORDER — OXYCODONE HCL 5 MG PO TABS
ORAL_TABLET | ORAL | Status: AC
  Filled 2024-07-21: qty 1

## 2024-07-21 MED ORDER — OXYMETAZOLINE HCL 0.05 % NA SOLN
NASAL | Status: DC | PRN
  Administered 2024-07-21: 1 via TOPICAL

## 2024-07-21 MED ORDER — OXYCODONE HCL 5 MG PO TABS
5.0000 mg | ORAL_TABLET | Freq: Once | ORAL | Status: AC | PRN
  Administered 2024-07-21: 5 mg via ORAL

## 2024-07-21 MED ORDER — MUPIROCIN 2 % EX OINT
TOPICAL_OINTMENT | CUTANEOUS | Status: AC
Start: 2024-07-21 — End: 2024-07-21
  Filled 2024-07-21: qty 22

## 2024-07-21 MED ORDER — LIDOCAINE 2% (20 MG/ML) 5 ML SYRINGE
INTRAMUSCULAR | Status: AC
  Filled 2024-07-21: qty 5

## 2024-07-21 MED ORDER — CHLORHEXIDINE GLUCONATE CLOTH 2 % EX PADS: 6.0000 | MEDICATED_PAD | Freq: Once | CUTANEOUS | Status: DC

## 2024-07-21 MED ORDER — PROPOFOL 10 MG/ML IV BOLUS
INTRAVENOUS | Status: AC
  Filled 2024-07-21: qty 20

## 2024-07-21 MED ORDER — LACTATED RINGERS IV SOLN: INTRAVENOUS | Status: DC

## 2024-07-21 MED ORDER — DEXAMETHASONE SODIUM PHOSPHATE 10 MG/ML IJ SOLN
INTRAMUSCULAR | Status: AC
  Filled 2024-07-21: qty 1

## 2024-07-21 MED ORDER — ROCURONIUM BROMIDE 100 MG/10ML IV SOLN
INTRAVENOUS | Status: DC | PRN
  Administered 2024-07-21: 50 mg via INTRAVENOUS

## 2024-07-21 MED ORDER — SUGAMMADEX SODIUM 200 MG/2ML IV SOLN
INTRAVENOUS | Status: DC | PRN
Start: 2024-07-21 — End: 2024-07-21
  Administered 2024-07-21: 200 mg via INTRAVENOUS

## 2024-07-21 MED ORDER — BUPIVACAINE-EPINEPHRINE (PF) 0.25% -1:200000 IJ SOLN
INTRAMUSCULAR | Status: DC | PRN
  Administered 2024-07-21: 5 mL

## 2024-07-21 MED ORDER — ROCURONIUM BROMIDE 10 MG/ML (PF) SYRINGE
PREFILLED_SYRINGE | INTRAVENOUS | Status: AC
  Filled 2024-07-21: qty 10

## 2024-07-21 MED ORDER — FENTANYL CITRATE (PF) 100 MCG/2ML IJ SOLN
INTRAMUSCULAR | Status: DC | PRN
  Administered 2024-07-21 (×2): 50 ug via INTRAVENOUS

## 2024-07-21 MED ORDER — PROPOFOL 10 MG/ML IV BOLUS
INTRAVENOUS | Status: DC | PRN
  Administered 2024-07-21: 200 mg via INTRAVENOUS

## 2024-07-21 MED ORDER — MIDAZOLAM HCL 5 MG/5ML IJ SOLN
INTRAMUSCULAR | Status: DC | PRN
  Administered 2024-07-21: 2 mg via INTRAVENOUS

## 2024-07-21 MED ORDER — MIDAZOLAM HCL 2 MG/2ML IJ SOLN
INTRAMUSCULAR | Status: AC
  Filled 2024-07-21: qty 2

## 2024-07-21 MED ORDER — SODIUM CHLORIDE 0.9 % IV SOLN
INTRAVENOUS | Status: AC | PRN
  Administered 2024-07-21: 500 mL

## 2024-07-21 MED ORDER — ACETAMINOPHEN 500 MG PO TABS
ORAL_TABLET | ORAL | Status: AC
  Filled 2024-07-21: qty 2

## 2024-07-21 MED ORDER — KETOROLAC TROMETHAMINE 30 MG/ML IJ SOLN
INTRAMUSCULAR | Status: DC | PRN
  Administered 2024-07-21: 30 mg via INTRAVENOUS

## 2024-07-21 MED ORDER — ACETAMINOPHEN 500 MG PO TABS
1000.0000 mg | ORAL_TABLET | Freq: Once | ORAL | Status: AC
  Administered 2024-07-21: 1000 mg via ORAL

## 2024-07-21 SURGICAL SUPPLY — 29 items
BLADE INF TURB ROT M4 2 5PK (BLADE) IMPLANT
BLADE SHAVER TURBINATE 11X2.9 (BLADE) IMPLANT
BLADE SURG 11 STRL SS (BLADE) IMPLANT
CANISTER SUCT 1200ML W/VALVE (MISCELLANEOUS) ×1 IMPLANT
COAGULATOR SUCT 8FR VV (MISCELLANEOUS) IMPLANT
COAGULATOR SUCT SWTCH 10FR 6 (ELECTROSURGICAL) IMPLANT
CORD BIPOLAR FORCEPS 12FT (ELECTRODE) IMPLANT
DRSG TELFA 3X8 NADH STRL (GAUZE/BANDAGES/DRESSINGS) IMPLANT
ELECTRODE REM PT RTRN 9FT ADLT (ELECTROSURGICAL) ×1 IMPLANT
FORCEPS BIPOLAR SPETZLER 8 1.0 (NEUROSURGERY SUPPLIES) IMPLANT
GAUZE SPONGE 2X2 STRL 8-PLY (GAUZE/BANDAGES/DRESSINGS) ×1 IMPLANT
GLOVE BIO SURGEON STRL SZ7 (GLOVE) ×1 IMPLANT
GLOVE BIOGEL PI IND STRL 7.0 (GLOVE) IMPLANT
GLOVE SURG SS PI 6.5 STRL IVOR (GLOVE) IMPLANT
GOWN STRL REUS W/ TWL LRG LVL3 (GOWN DISPOSABLE) ×2 IMPLANT
IV SET EXT 30 76VOL 4 MALE LL (IV SETS) IMPLANT
NDL PRECISIONGLIDE 27X1.5 (NEEDLE) ×1 IMPLANT
NEEDLE PRECISIONGLIDE 27X1.5 (NEEDLE) ×1 IMPLANT
NS IRRIG 1000ML POUR BTL (IV SOLUTION) ×1 IMPLANT
PACK BASIN DAY SURGERY FS (CUSTOM PROCEDURE TRAY) ×1 IMPLANT
PACK ENT DAY SURGERY (CUSTOM PROCEDURE TRAY) ×1 IMPLANT
PATTIES SURGICAL .5 X3 (DISPOSABLE) ×1 IMPLANT
SLEEVE SCD COMPRESS KNEE MED (STOCKING) ×1 IMPLANT
SPLINT NASAL AIRWAY SILICONE (MISCELLANEOUS) IMPLANT
SUT CHROMIC 4 0 RB 1X27 (SUTURE) ×1 IMPLANT
SUT MNCRL AB 4-0 PS2 18 (SUTURE) IMPLANT
SUT PLAIN 4 0 ~~LOC~~ 1 (SUTURE) IMPLANT
TOWEL GREEN STERILE FF (TOWEL DISPOSABLE) ×1 IMPLANT
TUBE SALEM SUMP 16F (TUBING) ×1 IMPLANT

## 2024-07-21 NOTE — Anesthesia Procedure Notes (Signed)
 Procedure Name: Intubation Date/Time: 07/21/2024 10:51 AM  Performed by: Buster Catheryn SAUNDERS, CRNAPre-anesthesia Checklist: Patient identified, Emergency Drugs available, Suction available and Patient being monitored Patient Re-evaluated:Patient Re-evaluated prior to induction Oxygen Delivery Method: Circle system utilized Preoxygenation: Pre-oxygenation with 100% oxygen Induction Type: IV induction Ventilation: Mask ventilation without difficulty Laryngoscope Size: Miller and 2 Grade View: Grade II Tube type: Oral Tube size: 7.0 mm Number of attempts: 1 Airway Equipment and Method: Stylet and Oral airway Placement Confirmation: ETT inserted through vocal cords under direct vision, positive ETCO2 and breath sounds checked- equal and bilateral Secured at: 22 cm Tube secured with: Tape Dental Injury: Teeth and Oropharynx as per pre-operative assessment

## 2024-07-21 NOTE — Discharge Instructions (Addendum)
 No Tylenol  until after 4:20pm today.  Last dose of Ibuprofen was 11:20am today.   Post Anesthesia Home Care Instructions  Activity: Get plenty of rest for the remainder of the day. A responsible individual must stay with you for 24 hours following the procedure.  For the next 24 hours, DO NOT: -Drive a car -Advertising copywriter -Drink alcoholic beverages -Take any medication unless instructed by your physician -Make any legal decisions or sign important papers.  Meals: Start with liquid foods such as gelatin or soup. Progress to regular foods as tolerated. Avoid greasy, spicy, heavy foods. If nausea and/or vomiting occur, drink only clear liquids until the nausea and/or vomiting subsides. Call your physician if vomiting continues.  Special Instructions/Symptoms: Your throat may feel dry or sore from the anesthesia or the breathing tube placed in your throat during surgery. If this causes discomfort, gargle with warm salt water. The discomfort should disappear within 24 hours.  If you had a scopolamine patch placed behind your ear for the management of post- operative nausea and/or vomiting:  1. The medication in the patch is effective for 72 hours, after which it should be removed.  Wrap patch in a tissue and discard in the trash. Wash hands thoroughly with soap and water. 2. You may remove the patch earlier than 72 hours if you experience unpleasant side effects which may include dry mouth, dizziness or visual disturbances. 3. Avoid touching the patch. Wash your hands with soap and water after contact with the patch.

## 2024-07-21 NOTE — Op Note (Signed)
 OPERATIVE NOTE  Sandra Oneill Date/Time of Admission: 07/21/2024  9:52 AM  CSN: [id number];FMW:[id number] Attending Provider: Maggie Hussar, MD Room/Bed: [facility]/NONE DOB: 25-Dec-1985 Age: 38 y.o.   Pre-Op Diagnosis: deviated nasal septum Nasal obstruction  Post-Op Diagnosis: deviated nasal septum Nasal obstruction  Procedure: Procedure(s): SEPTOPLASTY BILATERAL SUBMUCOSAL RESECTION OF INFERIOR TURBINATES  Anesthesia: General  Surgeon(s): Sandra Bently Wyss, MD  Staff: Circulator: Elaine Avelina PARAS, RN Scrub Person: Milford Asberry CROME  Implants: * No implants in log *  Specimens: * No specimens in log *  Complications: none  EBL: 50 ML  Condition: stable  Operative Findings:  Left septal deviation, turbinate hypertrophy  Description of Operation: After informed consent was obtained from the patient she was brought back to the operating room and intubated by anesthesia.  The septum and turbinates were injected with local anesthesia with epinephrine  and decongested with Afrin-soaked cottonoids.  Next a left hemitransfixion incision was made and the ipsilateral mucoperichondrial flap then raised.  A vertical incision was made in the septal cartilage and the contralateral flap was raised.  Superior and inferior cuts were made along the septum and the posterior bony aspect was fractured in the deviated portions of septum were removed with a Rudean forcep.  The incision was then closed with 4-0 Chromic Gut in a mattressing suture was used to close the septal flaps using a 4-0 Chromic Gut as well.  We then proceeded with the turbinate reduction and a stab incision was made at the head of each inferior turbinate and a submucosal plane was dissected out.  The bone was fractured laterally and using a microdebrider submucosal tissue was resected bilaterally from the inferior turbinates.  Minor bleeding was controlled with Afrin-soaked cottonoids and the patient was then  transferred over to anesthesia in stable condition.  Sandra Oneill ENT

## 2024-07-21 NOTE — Transfer of Care (Signed)
 Immediate Anesthesia Transfer of Care Note  Patient: Sandra Oneill  Procedure(s) Performed: SEPTOPLASTY WITH NASAL TURBINATE REDUCTION (Bilateral: Nose)  Patient Location: PACU  Anesthesia Type:General  Level of Consciousness: drowsy and patient cooperative  Airway & Oxygen Therapy: Patient Spontanous Breathing  Post-op Assessment: Report given to RN and Post -op Vital signs reviewed and stable  Post vital signs: Reviewed and stable  Last Vitals:  Vitals Value Taken Time  BP 130/91 07/21/24 11:37  Temp    Pulse 83 07/21/24 11:39  Resp 11 07/21/24 11:39  SpO2 93 % 07/21/24 11:39  Vitals shown include unfiled device data.  Last Pain:  Vitals:   07/21/24 1013  TempSrc: Temporal  PainSc: 0-No pain         Complications: No notable events documented.

## 2024-07-21 NOTE — Anesthesia Preprocedure Evaluation (Addendum)
 Anesthesia Evaluation  Patient identified by MRN, date of birth, ID band Patient awake    Reviewed: Allergy  & Precautions, NPO status , Patient's Chart, lab work & pertinent test results  Airway Mallampati: I  TM Distance: >3 FB Neck ROM: Full    Dental  (+) Teeth Intact, Dental Advisory Given   Pulmonary asthma    breath sounds clear to auscultation       Cardiovascular negative cardio ROS  Rhythm:Regular Rate:Normal     Neuro/Psych negative neurological ROS  negative psych ROS   GI/Hepatic negative GI ROS, Neg liver ROS,,,  Endo/Other  negative endocrine ROS    Renal/GU negative Renal ROS     Musculoskeletal negative musculoskeletal ROS (+)    Abdominal   Peds  Hematology negative hematology ROS (+)   Anesthesia Other Findings   Reproductive/Obstetrics                              Anesthesia Physical Anesthesia Plan  ASA: 2  Anesthesia Plan: General   Post-op Pain Management: Tylenol  PO (pre-op)* and Toradol IV (intra-op)*   Induction: Intravenous  PONV Risk Score and Plan: 4 or greater and Ondansetron , Dexamethasone, Midazolam and Scopolamine patch - Pre-op  Airway Management Planned: Oral ETT  Additional Equipment: None  Intra-op Plan:   Post-operative Plan: Extubation in OR  Informed Consent: I have reviewed the patients History and Physical, chart, labs and discussed the procedure including the risks, benefits and alternatives for the proposed anesthesia with the patient or authorized representative who has indicated his/her understanding and acceptance.     Dental advisory given  Plan Discussed with: CRNA  Anesthesia Plan Comments:          Anesthesia Quick Evaluation

## 2024-07-21 NOTE — Anesthesia Postprocedure Evaluation (Signed)
 Anesthesia Post Note  Patient: Sandra Oneill  Procedure(s) Performed: SEPTOPLASTY WITH NASAL TURBINATE REDUCTION (Bilateral: Nose)     Patient location during evaluation: PACU Anesthesia Type: General Level of consciousness: awake and alert Pain management: pain level controlled Vital Signs Assessment: post-procedure vital signs reviewed and stable Respiratory status: spontaneous breathing, nonlabored ventilation, respiratory function stable and patient connected to nasal cannula oxygen Cardiovascular status: blood pressure returned to baseline and stable Postop Assessment: no apparent nausea or vomiting Anesthetic complications: no   No notable events documented.  Last Vitals:  Vitals:   07/21/24 1230 07/21/24 1259  BP: 133/89 (!) 144/90  Pulse: 69 71  Resp: 11 16  Temp:  (!) 36.2 C  SpO2: 95% 96%    Last Pain:  Vitals:   07/21/24 1259  TempSrc: Temporal  PainSc: 3                  Franky JONETTA Bald

## 2024-07-21 NOTE — H&P (Signed)
 Sandra Oneill is an 38 y.o. female.    Chief Complaint:  Nasal obstruction  HPI: Patient presents today for planned elective septoplasty and turbinate reduction. She denies any interval change in history since office visit  Past Medical History:  Diagnosis Date   Abnormal Pap smear    3-4 years ago   Asthma    Deviated septum    Low back pain    Pre-diabetes    Urticaria     Past Surgical History:  Procedure Laterality Date   EXCISION ORAL TUMOR     WISDOM TOOTH EXTRACTION  12/15 and 2/16   2 teeth    Family History  Problem Relation Age of Onset   Diabetes Maternal Grandmother    Hypertension Mother     Social History:  reports that she has never smoked. She has never been exposed to tobacco smoke. She has never used smokeless tobacco. She reports current alcohol use. She reports that she does not use drugs.  Allergies:  Allergies  Allergen Reactions   Amoxicillin     REACTION: Nose bleeds and dizziness   Sulfamethoxazole-Trimethoprim     REACTION: itching or arms, legs, and lips as well as tingling of lips   Tomato Hives and Rash    RAW TOMATO    Medications Prior to Admission  Medication Sig Dispense Refill   albuterol  (PROAIR  HFA) 108 (90 Base) MCG/ACT inhaler INHALE TWO PUFFS BY MOUTH EVERY 6 HOURS AS NEEDED FOR SHORTNESS OF BREATH 9 each 6   BREO ELLIPTA  200-25 MCG/ACT AEPB INHALE 1 PUFF INTO THE LUNGS ONCE DAILY 60 each 0   levocetirizine (XYZAL ) 5 MG tablet Take 1 tablet (5 mg total) by mouth every evening. 30 tablet 5   Olopatadine-Mometasone  (RYALTRIS ) 665-25 MCG/ACT SUSP Place 1-2 sprays into the nose in the morning and at bedtime. 29 g 5   EPINEPHrine  0.3 mg/0.3 mL IJ SOAJ injection Inject 0.3 mg into the muscle as needed for anaphylaxis. 2 each 1   fluticasone  furoate-vilanterol (BREO ELLIPTA ) 100-25 MCG/ACT AEPB Inhale 1 puff into the lungs daily.      No results found for this or any previous visit (from the past 48 hours). No results  found.  ROS: negative other than stated in HPI  Height 5' 2 (1.575 m), weight 70.8 kg, last menstrual period 07/02/2024.  PHYSICAL EXAM: General: Resting comfortably in NAD  Lungs: Non-labored respiratinos Nose: moderate septal deviation, inferior turbinate hypertrophy   Assessment/Plan Nasal obstruction - Plan septoplasty and bilateral inferior turbinate reduction. Risks/benefits discussed and all questions answered   Darlyn Fries MD 07/21/2024, 9:59 AM

## 2024-07-22 ENCOUNTER — Encounter (HOSPITAL_BASED_OUTPATIENT_CLINIC_OR_DEPARTMENT_OTHER): Payer: Self-pay | Admitting: Otolaryngology

## 2024-07-30 ENCOUNTER — Ambulatory Visit

## 2024-07-30 DIAGNOSIS — J309 Allergic rhinitis, unspecified: Secondary | ICD-10-CM | POA: Diagnosis not present

## 2024-08-05 ENCOUNTER — Ambulatory Visit (INDEPENDENT_AMBULATORY_CARE_PROVIDER_SITE_OTHER)

## 2024-08-05 DIAGNOSIS — J309 Allergic rhinitis, unspecified: Secondary | ICD-10-CM

## 2024-08-07 ENCOUNTER — Ambulatory Visit

## 2024-08-07 ENCOUNTER — Ambulatory Visit: Admitting: Family Medicine

## 2024-08-07 DIAGNOSIS — J309 Allergic rhinitis, unspecified: Secondary | ICD-10-CM

## 2024-08-07 NOTE — Progress Notes (Deleted)
   [address] [place] [address] Dept: [phone]  FOLLOW UP NOTE  Patient ID: Sandra Oneill, female    DOB: 18-Oct-1985  Age: 38 y.o. MRN: [id number] Date of Office Visit: 08/07/2024  Assessment  Chief Complaint: No chief complaint on file.  HPI Sandra Oneill is a 38 year old female who presents to clinic for follow-up visit.  She was last seen in this clinic on 04/03/2024 by Arlean Mutter, FNP, for evaluation of asthma, allergic rhinitis, urticaria and atopic dermatitis.  Her last environmental allergy  skin testing on 05/18/2022 was positive to grass pollen, tree pollen, mold, dust mite, cat, and dog.  She began allergen immunotherapy on 04/28/2024.  Discussed the use of AI scribe software for clinical note transcription with the patient, who gave verbal consent to proceed.  History of Present Illness      Drug Allergies:  Allergies  Allergen Reactions   Amoxicillin     REACTION: Nose bleeds and dizziness   Sulfamethoxazole-Trimethoprim     REACTION: itching or arms, legs, and lips as well as tingling of lips   Tomato Hives and Rash    RAW TOMATO    Physical Exam: LMP 07/02/2024 (Exact Date)    Physical Exam  Diagnostics:    Assessment and Plan: No diagnosis found.  No orders of the defined types were placed in this encounter.   There are no Patient Instructions on file for this visit.  No follow-ups on file.    Thank you for the opportunity to care for this patient.  Please do not hesitate to contact me with questions.  Arlean Mutter, FNP [facility]

## 2024-08-07 NOTE — Patient Instructions (Incomplete)
 Asthma Continue Breo 100-1 puff once a day to prevent cough or wheeze Continue albuterol  2 puffs once every 4 hours if needed for cough or wheeze You may use albuterol  2 puffs 5 to 15 minutes before activity to decrease cough or wheeze  Allergic rhinitis Continue allergen avoidance measures directed toward grass pollen, tree pollen, mold, dust mite, cat, and dog as listed below Continue montelukast  10 mg once a day to control symptoms of allergic rhinitis Begin levocetirizine 5 mg once a day if needed for runny nose or itch.  This will replace cetirizine  Begin Flonase  2 sprays in each nostril once a day if needed for 1 stuffy nose.  In the right nostril, point the applicator out toward the right ear. In the left nostril, point the applicator out toward the left ear Continue azelastine 2 sprays in each nostril up to twice a day if needed for runny nose or itch Consider saline nasal rinses as needed for nasal symptoms. Use this before any medicated nasal sprays for best result Continue allergen immunotherapy and have access to an epinephrine  autoinjector set per protocol  Hives (urticaria) Take the least amount of medications while remaining hive free Levocetirizine (Xyzal ) 5 mg twice a day and famotidine  (Pepcid ) 20 mg twice a day. If no symptoms for 7-14 days then decrease to. Levocetirizine (Xyzal ) 5 mg twice a day and famotidine  (Pepcid ) 20 mg once a day.  If no symptoms for 7-14 days then decrease to. Levocetirizine (Xyzal ) 5 mg twice a day.  If no symptoms for 7-14 days then decrease to. Levocetirizine (Xyzal ) 5 mg once a day.   May use Benadryl  (diphenhydramine ) as needed for breakthrough hives       If symptoms return, then step up dosage  Keep a detailed symptom journal including foods eaten, contact with allergens, medications taken, weather changes.    Atopic dermatis Continue to follow-up with your dermatology specialist as recommended  Call the clinic if this treatment plan is  not working well for you.   Follow up in 6 months or sooner if needed.  Reducing Pollen Exposure The American Academy of All[employer] , Ast[employer] and [employer] suggests the following steps to reduce your exposure to pollen during allergy  seasons. Do not hang sheets or clothing out to dry; pollen may collect on these items. Do not mow lawns or spend time around freshly cut grass; mowing stirs up pollen. Keep windows closed at night.  Keep car windows closed while driving. Minimize morning activities outdoors, a time when pollen counts are usually at their highest. Stay indoors as much as possible when pollen counts or humidity is high and on windy days when pollen tends to remain in the air longer. Use air conditioning when possible.  Many air conditioners have filters that trap the pollen spores. Use a HEPA room air filter to remove pollen form the indoor air you breathe.  Control of Mold Allergen Mold and fungi can grow on a variety of surfaces provided certain temperature and moisture conditions exist.  Outdoor molds grow on plants, decaying vegetation and soil.  The major outdoor mold, Alternaria and Cladosporium, are found in very high numbers during hot and dry conditions.  Generally, a late Summer - Fall peak is seen for common outdoor fungal spores.  Rain will temporarily lower outdoor mold spore count, but counts rise rapidly when the rainy period ends.  The most important indoor molds are Aspergillus and Penicillium.  Dark, humid and poorly ventilated basements are ideal sites for mold  growth.  The next most common sites of mold growth are the bathroom and the kitchen.  Outdoor [employer] Use air conditioning and keep windows closed Avoid exposure to decaying vegetation. Avoid leaf raking. Avoid grain handling. Consider wearing a face mask if working in moldy areas.  Indoor Mold Control Maintain humidity below 50%. Clean washable surfaces with 5% bleach solution. Remove sources e.g.  Contaminated carpets.   Control of Dust Mite Allergen Dust mites play a major role in allergic asthma and rhinitis. They occur in environments with high humidity wherever human skin is found. Dust mites absorb humidity from the atmosphere (ie, they do not drink) and feed on organic matter (including shed human and animal skin). Dust mites are a microscopic type of insect that you cannot see with the naked eye. High levels of dust mites have been detected from mattresses, pillows, carpets, upholstered furniture, bed covers, clothes, soft toys and any woven material. The principal allergen of the dust mite is found in its feces. A gram of dust may contain 1,000 mites and 250,000 fecal particles. Mite antigen is easily measured in the air during house cleaning activities. Dust mites do not bite and do not cause harm to humans, other than by triggering allergies/asthma.  Ways to decrease your exposure to dust mites in your home:  1. Encase mattresses, box springs and pillows with a mite-impermeable barrier or cover  2. Wash sheets, blankets and drapes weekly in hot water (130 F) with detergent and dry them in a dryer on the hot setting.  3. Have the room cleaned frequently with a vacuum cleaner and a damp dust-mop. For carpeting or rugs, vacuuming with a vacuum cleaner equipped with a high-efficiency particulate air (HEPA) filter. The dust mite allergic individual should not be in a room which is being cleaned and should wait 1 hour after cleaning before going into the room.  4. Do not sleep on upholstered furniture (eg, couches).  5. If possible removing carpeting, upholstered furniture and drapery from the home is ideal. Horizontal blinds should be eliminated in the rooms where the person spends the most time (bedroom, study, television room). Washable vinyl, roller-type shades are optimal.  6. Remove all non-washable stuffed toys from the bedroom. Wash stuffed toys weekly like sheets and blankets  above.  7. Reduce indoor humidity to less than 50%. Inexpensive humidity monitors can be purchased at most hardware stores. Do not use a humidifier as can make the problem worse and are not recommended.  Control of Dog or Cat Allergen Avoidance is the best way to manage a dog or cat allergy . If you have a dog or cat and are allergic to dog or cats, consider removing the dog or cat from the home. If you have a dog or cat but don't want to find it a new home, or if your family wants a pet even though someone in the household is allergic, here are some strategies that may help keep symptoms at bay:  Keep the pet out of your bedroom and restrict it to only a few rooms. Be advised that keeping the dog or cat in only one room will not limit the allergens to that room. Don't pet, hug or kiss the dog or cat; if you do, wash your hands with soap and water. High-efficiency particulate air (HEPA) cleaners run continuously in a bedroom or living room can reduce allergen levels over time. Regular use of a high-efficiency vacuum cleaner or a central vacuum can reduce allergen  levels. Giving your dog or cat a bath at least once a week can reduce airborne allergen.

## 2024-08-12 ENCOUNTER — Ambulatory Visit

## 2024-08-12 DIAGNOSIS — J309 Allergic rhinitis, unspecified: Secondary | ICD-10-CM

## 2024-08-14 ENCOUNTER — Ambulatory Visit (INDEPENDENT_AMBULATORY_CARE_PROVIDER_SITE_OTHER)

## 2024-08-14 DIAGNOSIS — J309 Allergic rhinitis, unspecified: Secondary | ICD-10-CM | POA: Diagnosis not present

## 2024-08-21 ENCOUNTER — Other Ambulatory Visit: Payer: Self-pay | Admitting: Family Medicine

## 2024-08-21 ENCOUNTER — Ambulatory Visit

## 2024-08-21 DIAGNOSIS — J309 Allergic rhinitis, unspecified: Secondary | ICD-10-CM | POA: Diagnosis not present

## 2024-08-28 ENCOUNTER — Ambulatory Visit

## 2024-08-28 DIAGNOSIS — J309 Allergic rhinitis, unspecified: Secondary | ICD-10-CM

## 2024-08-30 ENCOUNTER — Other Ambulatory Visit: Payer: Self-pay | Admitting: Family Medicine

## 2024-09-04 ENCOUNTER — Ambulatory Visit (INDEPENDENT_AMBULATORY_CARE_PROVIDER_SITE_OTHER): Admitting: *Deleted

## 2024-09-04 DIAGNOSIS — J309 Allergic rhinitis, unspecified: Secondary | ICD-10-CM

## 2024-09-09 ENCOUNTER — Ambulatory Visit (INDEPENDENT_AMBULATORY_CARE_PROVIDER_SITE_OTHER)

## 2024-09-09 DIAGNOSIS — J309 Allergic rhinitis, unspecified: Secondary | ICD-10-CM

## 2024-09-18 ENCOUNTER — Ambulatory Visit

## 2024-09-18 DIAGNOSIS — J309 Allergic rhinitis, unspecified: Secondary | ICD-10-CM | POA: Diagnosis not present

## 2024-09-18 MED ORDER — EPINEPHRINE 0.3 MG/0.3ML IJ SOAJ: 0.3000 mg | INTRAMUSCULAR | 1 refills | Status: AC | PRN

## 2024-09-25 ENCOUNTER — Ambulatory Visit (INDEPENDENT_AMBULATORY_CARE_PROVIDER_SITE_OTHER)

## 2024-09-25 DIAGNOSIS — J309 Allergic rhinitis, unspecified: Secondary | ICD-10-CM | POA: Diagnosis not present

## 2024-10-02 ENCOUNTER — Ambulatory Visit

## 2024-10-02 DIAGNOSIS — J309 Allergic rhinitis, unspecified: Secondary | ICD-10-CM

## 2024-10-07 ENCOUNTER — Ambulatory Visit

## 2024-10-07 DIAGNOSIS — J309 Allergic rhinitis, unspecified: Secondary | ICD-10-CM | POA: Diagnosis not present

## 2024-10-13 ENCOUNTER — Ambulatory Visit

## 2024-10-13 DIAGNOSIS — J309 Allergic rhinitis, unspecified: Secondary | ICD-10-CM

## 2024-10-21 DIAGNOSIS — J3081 Allergic rhinitis due to animal (cat) (dog) hair and dander: Secondary | ICD-10-CM | POA: Diagnosis not present

## 2024-10-21 DIAGNOSIS — J3089 Other allergic rhinitis: Secondary | ICD-10-CM | POA: Diagnosis not present

## 2024-10-21 DIAGNOSIS — J302 Other seasonal allergic rhinitis: Secondary | ICD-10-CM | POA: Diagnosis not present

## 2024-10-21 NOTE — Progress Notes (Signed)
 VIALS MADE ON 10/21/24

## 2024-10-30 ENCOUNTER — Ambulatory Visit

## 2024-10-30 DIAGNOSIS — J302 Other seasonal allergic rhinitis: Secondary | ICD-10-CM

## 2024-11-18 ENCOUNTER — Ambulatory Visit

## 2024-11-18 DIAGNOSIS — J302 Other seasonal allergic rhinitis: Secondary | ICD-10-CM
